# Patient Record
Sex: Male | Born: 1995 | Race: Asian | Hispanic: No | Marital: Single | Smoking: Never smoker
Health system: Southern US, Community
[De-identification: ages and names within clinical notes are randomized; demographics above are authoritative.]

## PROBLEM LIST (undated history)

## (undated) DIAGNOSIS — G43909 Migraine, unspecified, not intractable, without status migrainosus: Secondary | ICD-10-CM

## (undated) DIAGNOSIS — K5190 Ulcerative colitis, unspecified, without complications: Secondary | ICD-10-CM

---

## 2014-06-17 ENCOUNTER — Emergency Department (HOSPITAL_COMMUNITY): Payer: No Typology Code available for payment source

## 2014-06-17 ENCOUNTER — Emergency Department (HOSPITAL_COMMUNITY)
Admission: EM | Admit: 2014-06-17 | Discharge: 2014-06-17 | Disposition: A | Discharge status: Home / Self Care | Payer: No Typology Code available for payment source | Attending: Emergency Medicine | Admitting: Emergency Medicine

## 2014-06-17 ENCOUNTER — Encounter (HOSPITAL_COMMUNITY): Payer: Self-pay | Admitting: Emergency Medicine

## 2014-06-17 DIAGNOSIS — S50812A Abrasion of left forearm, initial encounter: Secondary | ICD-10-CM | POA: Insufficient documentation

## 2014-06-17 DIAGNOSIS — M25552 Pain in left hip: Secondary | ICD-10-CM

## 2014-06-17 DIAGNOSIS — S60511A Abrasion of right hand, initial encounter: Secondary | ICD-10-CM | POA: Insufficient documentation

## 2014-06-17 DIAGNOSIS — S060X0A Concussion without loss of consciousness, initial encounter: Secondary | ICD-10-CM | POA: Diagnosis not present

## 2014-06-17 DIAGNOSIS — Y92410 Unspecified street and highway as the place of occurrence of the external cause: Secondary | ICD-10-CM | POA: Diagnosis not present

## 2014-06-17 DIAGNOSIS — S79912A Unspecified injury of left hip, initial encounter: Secondary | ICD-10-CM | POA: Insufficient documentation

## 2014-06-17 DIAGNOSIS — R110 Nausea: Secondary | ICD-10-CM | POA: Insufficient documentation

## 2014-06-17 DIAGNOSIS — S098XXA Other specified injuries of head, initial encounter: Secondary | ICD-10-CM | POA: Diagnosis present

## 2014-06-17 DIAGNOSIS — T148XXA Other injury of unspecified body region, initial encounter: Secondary | ICD-10-CM

## 2014-06-17 DIAGNOSIS — R42 Dizziness and giddiness: Secondary | ICD-10-CM | POA: Diagnosis not present

## 2014-06-17 DIAGNOSIS — Y9389 Activity, other specified: Secondary | ICD-10-CM | POA: Insufficient documentation

## 2014-06-17 DIAGNOSIS — S30810A Abrasion of lower back and pelvis, initial encounter: Secondary | ICD-10-CM | POA: Diagnosis not present

## 2014-06-17 MED ORDER — HYDROCODONE-ACETAMINOPHEN 5-325 MG PO TABS: 1.0000 | ORAL_TABLET | Freq: Four times a day (QID) | ORAL | Status: DC | PRN

## 2014-06-17 MED ORDER — HYDROCODONE-ACETAMINOPHEN 5-325 MG PO TABS
1.0000 | ORAL_TABLET | Freq: Once | ORAL | Status: AC
  Administered 2014-06-17: 1 via ORAL
  Filled 2014-06-17: qty 1
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
## 2014-06-17 NOTE — ED Provider Notes (Signed)
Medical screening examination/treatment/procedure(s) were performed by non-physician practitioner and as supervising physician I was immediately available for consultation/collaboration.   EKG Interpretation None        Hoy Morn, MD 06/17/14 1626
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
## 2014-06-17 NOTE — ED Notes (Signed)
AVS explained in detail- knows not to take extra tylenol, drink alcohol, drive/operate heavy machinery while taking prescribed medications. Bacitracin and dry dressings applied to abrasions on left arm, hand, and posterior head. No other questions/concerns.

## 2014-06-17 NOTE — Discharge Instructions (Signed)
Abrasion An abrasion is a cut or scrape of the skin. Abrasions do not extend through all layers of the skin and most heal within 10 days. It is important to care for your abrasion properly to prevent infection. CAUSES  Most abrasions are caused by falling on, or gliding across, the ground or other surface. When your skin rubs on something, the outer and inner layer of skin rubs off, causing an abrasion. DIAGNOSIS  Your caregiver will be able to diagnose an abrasion during a physical exam.  TREATMENT  Your treatment depends on how large and deep the abrasion is. Generally, your abrasion will be cleaned with water and a mild soap to remove any dirt or debris. An antibiotic ointment may be put over the abrasion to prevent an infection. A bandage (dressing) may be wrapped around the abrasion to keep it from getting dirty.  You may need a tetanus shot if:  You cannot remember when you had your last tetanus shot.  You have never had a tetanus shot.  The injury broke your skin. If you get a tetanus shot, your arm may swell, get red, and feel warm to the touch. This is common and not a problem. If you need a tetanus shot and you choose not to have one, there is a rare chance of getting tetanus. Sickness from tetanus can be serious.  HOME CARE INSTRUCTIONS   If a dressing was applied, change it at least once a day or as directed by your caregiver. If the bandage sticks, soak it off with warm water.   Wash the area with water and a mild soap to remove all the ointment 2 times a day. Rinse off the soap and pat the area dry with a clean towel.   Reapply any ointment as directed by your caregiver. This will help prevent infection and keep the bandage from sticking. Use gauze over the wound and under the dressing to help keep the bandage from sticking.   Change your dressing right away if it becomes wet or dirty.   Only take over-the-counter or prescription medicines for pain, discomfort, or fever as  directed by your caregiver.   Follow up with your caregiver within 24-48 hours for a wound check, or as directed. If you were not given a wound-check appointment, look closely at your abrasion for redness, swelling, or pus. These are signs of infection. SEEK IMMEDIATE MEDICAL CARE IF:   You have increasing pain in the wound.   You have redness, swelling, or tenderness around the wound.   You have pus coming from the wound.   You have a fever or persistent symptoms for more than 2-3 days.  You have a fever and your symptoms suddenly get worse.  You have a bad smell coming from the wound or dressing.  MAKE SURE YOU:   Understand these instructions.  Will watch your condition.  Will get help right away if you are not doing well or get worse. Document Released: 06/04/2005 Document Revised: 08/11/2012 Document Reviewed: 07/29/2011 [facility] Patient Information 2015 [place], [place]. This information is not intended to replace advice given to you by your health care provider. Make sure you discuss any questions you have with your health care provider.  Bicycling, Adult Cyclists  Whether motivated by recreation or transportation, more adults are taking up cycling than ever before. Learning more about cycling greatly increases confidence. And it can be a great aid in learning to share the road more effectively.  If you are  using your bicycle in different situations than you previously have, such as switching from occasional short recreational rides to regularly commuting to work, you may want to take a short workshop. Begin by assessing yourself: How confident are you in your cycling skills? What would you like to know more about? Are there particular kinds of cycling you would like to try out? Courses and workshops may focus on learning to race, long distance touring, teaching children to cycle safely, commuting, or bike repairs. With that in mind, adult cyclists may wish to check around  their community for bike clubs, classes, rides, and other cycling opportunities. Check with the League of [employer] Bicyclists (www.bikeleague.org) for a listing of instructional opportunities available in your area.  Brush up on riding skills and rules if it has been a while since you cycled regularly.  Adult cyclists who wish to cycle with small children, and cyclists needing to transport cargo, should investigate the various child seats and trailers available. Determine which are the safest and which will work best for you.  Adult cyclists should learn more about off-road cycling, touring, and racing before participating in these activities. Adult cyclists are encouraged to try cycling on multi-use paths. Remember to respect others' needs on the trails.  Do not underestimate the importance of wearing a helmet. Accidents can happen anywhere. Cyclists should always wear a helmet.  Adult cyclists should learn how to handle harassment from motorists and others in traffic. It is in your best interest not to return any harassment or insults.  Just like a car, a bicycle requires basic maintenance to keep running smoothly and safely. Bikes are easy to work on and you can save money by learning bike maintenance. This can be done by picking up a manual and taking a repair course. Those who really do not have time should keep their bicycles regularly serviced at a good bike shop.  Bicycling can fit into one's everyday life. Substitute a bike ride for a car trip. Adult cyclists should know the health and environmental benefits of bicycling. Document Released: 11/15/2003 Document Revised: 01/09/2014 Document Reviewed: 08/21/2008 [facility] Patient Information 2015 [place], [place]. This information is not intended to replace advice given to you by your health care provider. Make sure you discuss any questions you have with your health care provider.  Concussion A concussion is a brain injury. It is caused  by:  A hit to the head.  A quick and sudden movement (jolt) of the head or neck. A concussion is usually not life threatening. Even so, it can cause serious problems. If you had a concussion before, you may have concussion-like problems after a hit to your head. HOME CARE General Instructions  Follow your doctor's directions carefully.  Take medicines only as told by your doctor.  Only take medicines your doctor says are safe.  Do not drink alcohol until your doctor says it is okay. Alcohol and some drugs can slow down healing. They can also put you at risk for further injury.  If you are having trouble remembering things, write them down.  Try to do one thing at a time if you get distracted easily. For example, do not watch TV while making dinner.  Talk to your family members or close friends when making important decisions.  Follow up with your doctor as told.  Watch your symptoms. Tell others to do the same. Serious problems can sometimes happen after a concussion. Older adults are more likely to have these problems.  Tell your teachers, school nurse, school counselor, coach, Product/process development scientist, or work Freight forwarder about your concussion. Tell them about what you can or cannot do. They should watch to see if:  It gets even harder for you to pay attention or concentrate.  It gets even harder for you to remember things or learn new things.  You need more time than normal to finish things.  You become annoyed (irritable) more than before.  You are not able to deal with stress as well.  You have more problems than before.  Rest. Make sure you:  Get plenty of sleep at night.  Go to sleep early.  Go to bed at the same time every day. Try to wake up at the same time.  Rest during the day.  Take naps when you feel tired.  Limit activities where you have to think a lot or concentrate. These include:  Doing homework.  Doing work related to a job.  Watching TV.  Using  the computer. Returning To Your Regular Activities Return to your normal activities slowly, not all at once. You must give your body and brain enough time to heal.   Do not play sports or do other athletic activities until your doctor says it is okay.  Ask your doctor when you can drive, ride a bicycle, or work other vehicles or machines. Never do these things if you feel dizzy.  Ask your doctor about when you can return to work or school. Preventing Another Concussion It is very important to avoid another brain injury, especially before you have healed. In rare cases, another injury can lead to permanent brain damage, brain swelling, or death. The risk of this is greatest during the first 7-10 days after your injury. Avoid injuries by:   Wearing a seat belt when riding in a car.  Not drinking too much alcohol.  Avoiding activities that could lead to a second concussion (such as contact sports).  Wearing a helmet when doing activities like:  Biking.  Skiing.  Skateboarding.  Skating.  Making your home safer by:  Removing things from the floor or stairways that could make you trip.  Using grab bars in bathrooms and handrails by stairs.  Placing non-slip mats on floors and in bathtubs.  Improve lighting in dark areas. GET HELP IF:  It gets even harder for you to pay attention or concentrate.  It gets even harder for you to remember things or learn new things.  You need more time than normal to finish things.  You become annoyed (irritable) more than before.  You are not able to deal with stress as well.  You have more problems than before.  You have problems keeping your balance.  You are not able to react quickly when you should. Get help if you have any of these problems for more than 2 weeks:   Lasting (chronic) headaches.  Dizziness or trouble balancing.  Feeling sick to your stomach (nausea).  Seeing (vision) problems.  Being affected by noises or  light more than normal.  Feeling sad, low, down in the dumps, blue, gloomy, or empty (depressed).  Mood changes (mood swings).  Feeling of fear or nervousness about what may happen (anxiety).  Feeling annoyed.  Memory problems.  Problems concentrating or paying attention.  Sleep problems.  Feeling tired all the time. GET HELP RIGHT AWAY IF:   You have bad headaches or your headaches get worse.  You have weakness (even if it is in one hand,  leg, or part of the face).  You have loss of feeling (numbness).  You feel off balance.  You keep throwing up (vomiting).  You feel tired.  One black center of your eye (pupil) is larger than the other.  You twitch or shake violently (convulse).  Your speech is not clear (slurred).  You are more confused, easily angered (agitated), or annoyed than before.  You have more trouble resting than before.  You are unable to recognize people or places.  You have neck pain.  It is difficult to wake you up.  You have unusual behavior changes.  You pass out (lose consciousness). MAKE SURE YOU:   Understand these instructions.  Will watch your condition.  Will get help right away if you are not doing well or get worse. Document Released: 08/13/2009 Document Revised: 01/09/2014 Document Reviewed: 03/17/2013 [facility] Patient Information 2015 [place], [place]. This information is not intended to replace advice given to you by your health care provider. Make sure you discuss any questions you have with your health care provider.

## 2014-10-23 ENCOUNTER — Emergency Department (HOSPITAL_COMMUNITY)
Admission: EM | Admit: 2014-10-23 | Discharge: 2014-10-23 | Disposition: A | Discharge status: Home / Self Care | Payer: Medicaid Other | Attending: Emergency Medicine | Admitting: Emergency Medicine

## 2014-10-23 ENCOUNTER — Encounter (HOSPITAL_COMMUNITY): Payer: Self-pay

## 2014-10-23 ENCOUNTER — Emergency Department (HOSPITAL_COMMUNITY): Payer: Medicaid Other

## 2014-10-23 DIAGNOSIS — R1030 Lower abdominal pain, unspecified: Secondary | ICD-10-CM

## 2014-10-23 DIAGNOSIS — A09 Infectious gastroenteritis and colitis, unspecified: Secondary | ICD-10-CM | POA: Insufficient documentation

## 2014-10-23 DIAGNOSIS — Z792 Long term (current) use of antibiotics: Secondary | ICD-10-CM | POA: Insufficient documentation

## 2014-10-23 DIAGNOSIS — Z7982 Long term (current) use of aspirin: Secondary | ICD-10-CM | POA: Diagnosis not present

## 2014-10-23 LAB — COMPREHENSIVE METABOLIC PANEL
ALBUMIN: 3.9 g/dL (ref 3.5–5.2)
ALT: 22 U/L (ref 0–53)
AST: 19 U/L (ref 0–37)
Alkaline Phosphatase: 61 U/L (ref 39–117)
Anion gap: 8 (ref 5–15)
BILIRUBIN TOTAL: 0.8 mg/dL (ref 0.3–1.2)
BUN: 8 mg/dL (ref 6–23)
CALCIUM: 9.1 mg/dL (ref 8.4–10.5)
CHLORIDE: 100 mmol/L (ref 96–112)
CO2: 29 mmol/L (ref 19–32)
Creatinine, Ser: 0.93 mg/dL (ref 0.50–1.35)
GFR calc Af Amer: >90 mL/min (ref >90)
GFR calc non Af Amer: >90 mL/min (ref >90)
GLUCOSE: 88 mg/dL (ref 70–99)
Potassium: 3.8 mmol/L (ref 3.5–5.1)
SODIUM: 137 mmol/L (ref 135–145)
TOTAL PROTEIN: 7.5 g/dL (ref 6.0–8.3)

## 2014-10-23 LAB — CBC WITH DIFFERENTIAL/PLATELET
Basophils Absolute: 0 10*3/uL (ref 0.0–0.1)
Basophils Relative: 1 % (ref 0–1)
EOS ABS: 0.6 10*3/uL (ref 0.0–0.7)
EOS PCT: 6 % — AB (ref 0–5)
HEMATOCRIT: 43.7 % (ref 39.0–52.0)
Hemoglobin: 15.4 g/dL (ref 13.0–17.0)
Lymphocytes Relative: 21 % (ref 12–46)
Lymphs Abs: 1.8 10*3/uL (ref 0.7–4.0)
MCH: 28.7 pg (ref 26.0–34.0)
MCHC: 35.2 g/dL (ref 30.0–36.0)
MCV: 81.5 fL (ref 78.0–100.0)
MONOS PCT: 12 % (ref 3–12)
Monocytes Absolute: 1.1 10*3/uL — ABNORMAL HIGH (ref 0.1–1.0)
NEUTROS PCT: 60 % (ref 43–77)
Neutro Abs: 5.4 10*3/uL (ref 1.7–7.7)
Platelets: 267 10*3/uL (ref 150–400)
RBC: 5.36 MIL/uL (ref 4.22–5.81)
RDW: 12.5 % (ref 11.5–15.5)
WBC: 8.9 10*3/uL (ref 4.0–10.5)

## 2014-10-23 LAB — URINALYSIS, ROUTINE W REFLEX MICROSCOPIC
Bilirubin Urine: NEGATIVE
Glucose, UA: NEGATIVE mg/dL
Ketones, ur: 40 mg/dL — AB
Leukocytes, UA: NEGATIVE
Nitrite: NEGATIVE
PH: 6 (ref 5.0–8.0)
Protein, ur: NEGATIVE mg/dL
SPECIFIC GRAVITY, URINE: 1.014 (ref 1.005–1.030)
Urobilinogen, UA: 0.2 mg/dL (ref 0.0–1.0)

## 2014-10-23 LAB — LIPASE, BLOOD: Lipase: 25 U/L (ref 11–59)

## 2014-10-23 LAB — URINE MICROSCOPIC-ADD ON

## 2014-10-23 LAB — POC OCCULT BLOOD, ED: FECAL OCCULT BLD: POSITIVE — AB

## 2014-10-23 MED ORDER — IOHEXOL 300 MG/ML  SOLN: 50.0000 mL | Freq: Once | INTRAMUSCULAR | Status: DC | PRN

## 2014-10-23 MED ORDER — SODIUM CHLORIDE 0.9 % IV BOLUS (SEPSIS)
1000.0000 mL | INTRAVENOUS | Status: AC
  Administered 2014-10-23: 1000 mL via INTRAVENOUS

## 2014-10-23 MED ORDER — IOHEXOL 300 MG/ML  SOLN
100.0000 mL | Freq: Once | INTRAMUSCULAR | Status: AC | PRN
  Administered 2014-10-23: 100 mL via INTRAVENOUS

## 2014-10-23 MED ORDER — OXYCODONE-ACETAMINOPHEN 5-325 MG PO TABS: 1.0000 | ORAL_TABLET | Freq: Four times a day (QID) | ORAL | Status: DC | PRN

## 2014-10-23 NOTE — ED Notes (Signed)
Pt presents with c/o abdominal pain and rectal bleeding. Pt reports he initially had diarrhea that started a week ago but approx 3 days ago he started having some rectal bleeding. Pt denies any vomiting.

## 2014-10-23 NOTE — ED Provider Notes (Signed)
CSN: [id number]     Arrival date & time 10/23/14  41 History   First MD Initiated Contact with Patient 10/23/14 1830     Chief Complaint  Patient presents with  . Abdominal Pain  . Rectal Bleeding     (Consider location/radiation/quality/duration/timing/severity/associated sxs/prior Treatment) Patient is a 19 y.o. male presenting with abdominal pain and hematochezia. The history is provided by the patient.  Abdominal Pain Pain location: lower abdomen. Pain quality: burning   Pain radiates to:  Does not radiate Pain severity:  Mild Onset quality:  Gradual Duration:  3 days Timing:  Constant Progression:  Unchanged Chronicity:  New Context: suspicious food intake   Relieved by:  Nothing Worsened by:  Nothing tried Ineffective treatments:  None tried Associated symptoms: diarrhea, hematochezia and nausea   Associated symptoms: no chest pain, no cough, no dysuria, no fever, no hematuria, no shortness of breath and no vomiting   Diarrhea:    Quality:  Watery (dark, and dark w/ blood)   Severity:  Moderate   Duration:  1 week   Timing:  Constant   Progression:  Unchanged Rectal Bleeding Associated symptoms: abdominal pain   Associated symptoms: no fever and no vomiting     History reviewed. No pertinent past medical history. History reviewed. No pertinent past surgical history. No family history on file. History  Substance Use Topics  . Smoking status: Never Smoker   . Smokeless tobacco: Not on file  . Alcohol Use: No    Review of Systems  Constitutional: Negative for fever.  HENT: Negative for drooling and rhinorrhea.   Eyes: Negative for pain.  Respiratory: Negative for cough and shortness of breath.   Cardiovascular: Negative for chest pain and leg swelling.  Gastrointestinal: Positive for nausea, abdominal pain, diarrhea and hematochezia. Negative for vomiting.  Genitourinary: Negative for dysuria and hematuria.  Musculoskeletal: Negative for gait problem and  neck pain.  Skin: Negative for color change.  Neurological: Negative for numbness and headaches.  Hematological: Negative for adenopathy.  Psychiatric/Behavioral: Negative for behavioral problems.  All other systems reviewed and are negative.     Allergies  Review of patient's allergies indicates no known allergies.  Home Medications   Prior to Admission medications   Medication Sig Start Date End Date Taking? Authorizing Provider  aspirin 325 MG tablet Take 325 mg by mouth daily as needed for headache (headache).   Yes Historical Provider, MD  ibuprofen (ADVIL,MOTRIN) 200 MG tablet Take 400 mg by mouth every 6 (six) hours as needed for moderate pain (pain).   Yes Historical Provider, MD  loperamide (IMODIUM) 2 MG capsule Take 2 mg by mouth daily as needed for diarrhea or loose stools (diarrhea).   Yes Historical Provider, MD  ciprofloxacin (CIPRO) 500 MG tablet Take 500 mg by mouth 2 (two) times daily. For 7 Days.    Historical Provider, MD  HYDROcodone-acetaminophen (NORCO/VICODIN) 5-325 MG per tablet Take 1-2 tablets by mouth every 6 (six) hours as needed. Patient not taking: Reported on 10/23/2014 06/17/14   Glendell Docker, NP   BP 132/54 mmHg  Pulse 98  Temp(Src) 97.9 F (36.6 C) (Oral)  Resp 18  SpO2 100% Physical Exam  Constitutional: He is oriented to person, place, and time. He appears well-developed and well-nourished.  HENT:  Head: Normocephalic and atraumatic.  Right Ear: External ear normal.  Left Ear: External ear normal.  Nose: Nose normal.  Mouth/Throat: Oropharynx is clear and moist. No oropharyngeal exudate.  Eyes: Conjunctivae and EOM are normal.  Pupils are equal, round, and reactive to light.  Neck: Normal range of motion. Neck supple.  Cardiovascular: Normal rate, regular rhythm, normal heart sounds and intact distal pulses.  Exam reveals no gallop and no friction rub.   No murmur heard. Pulmonary/Chest: Effort normal and breath sounds normal. No  respiratory distress. He has no wheezes.  Abdominal: Soft. Bowel sounds are normal. He exhibits no distension. There is tenderness. There is no rebound and no guarding.  Mild tenderness to palpation of the lower abdomen diffusely.   Genitourinary:  Normal-appearing external rectum. Poor stool sample but no obvious blood seen. Otherwise normal rectal exam.  Musculoskeletal: Normal range of motion. He exhibits no edema or tenderness.  Neurological: He is alert and oriented to person, place, and time.  Skin: Skin is warm and dry.  Psychiatric: He has a normal mood and affect. His behavior is normal.  Nursing note and vitals reviewed.   ED Course  Procedures (including critical care time) Labs Review Labs Reviewed  CBC WITH DIFFERENTIAL/PLATELET - Abnormal; Notable for the following:    Monocytes Absolute 1.1 (*)    Eosinophils Relative 6 (*)    All other components within normal limits  URINALYSIS, ROUTINE W REFLEX MICROSCOPIC - Abnormal; Notable for the following:    Hgb urine dipstick SMALL (*)    Ketones, ur 40 (*)    All other components within normal limits  POC OCCULT BLOOD, ED - Abnormal; Notable for the following:    Fecal Occult Bld POSITIVE (*)    All other components within normal limits  COMPREHENSIVE METABOLIC PANEL  LIPASE, BLOOD  URINE MICROSCOPIC-ADD ON  OCCULT BLOOD X 1 CARD TO LAB, STOOL    Imaging Review Ct Abdomen Pelvis W Contrast  10/23/2014   CLINICAL DATA:  Lower abdominal pain and diarrhea for 3 days.  EXAM: CT ABDOMEN AND PELVIS WITH CONTRAST  TECHNIQUE: Multidetector CT imaging of the abdomen and pelvis was performed using the standard protocol following bolus administration of intravenous contrast.  CONTRAST:  100 mL OMNIPAQUE IOHEXOL 300 MG/ML  SOLN  COMPARISON:  None.  FINDINGS: The lung bases are clear no pleural or pericardial effusion.  There is thickening of the walls of the colon, particularly the ascending and transverse. The terminal ileum and  remainder of the small bowel appear normal. The stomach is unremarkable. No fluid collection is seen. Small right lower quadrant lymph nodes are noted and likely reactive. The appendix is normal in appearance. There is no pneumatosis, portal venous gas, fluid collection for free air.  The gallbladder, liver, spleen, adrenal glands, pancreas and kidneys appear normal. No focal bony abnormality is identified.  IMPRESSION: Findings compatible with infectious or inflammatory colitis most notable in the ascending and transverse colon. The examination is otherwise negative.   Electronically Signed   By: Inge Rise M.D.   On: 10/23/2014 21:02     EKG Interpretation None      MDM   Final diagnoses:  Lower abdominal pain  Infectious colitis    7:53 PM 19 y.o. male who presents with lower abdominal pain and diarrhea which began approximately 1 week ago after eating a hamburger from cookout. He notes that he has had mild diarrhea for the last week which worsened over the last 3 days and has become dark and bloody appearing. He denies any fevers, or vomiting. He has had some nausea. Vital signs unremarkable here. We'll get screening labs and imaging.  10:29 PM: I interpreted/reviewed the labs and/or imaging  which were non-contributory.   CT consistent with inflammatory and infectious colitis. The patient's pain is minimal and he has required no pain medicine here. We'll provide prescription for Percocet for home. He was given a prescription for Cipro for 7 days by the urgent care review seen at prior to coming here. I have discussed the diagnosis/risks/treatment options with the patient and believe the pt to be eligible for discharge home to follow-up with at the wellness center as needed. We also discussed returning to the ED immediately if new or worsening sx occur. We discussed the sx which are most concerning (e.g., worsening pain, fever, worsening blood in diarrhea) that necessitate immediate return.  Medications administered to the patient during their visit and any new prescriptions provided to the patient are listed below.  Medications given during this visit Medications  iohexol (OMNIPAQUE) 300 MG/ML solution 50 mL (not administered)  sodium chloride 0.9 % bolus 1,000 mL (1,000 mLs Intravenous New Bag/Given 10/23/14 1938)  iohexol (OMNIPAQUE) 300 MG/ML solution 100 mL (100 mLs Intravenous Contrast Given 10/23/14 2047)    New Prescriptions   OXYCODONE-ACETAMINOPHEN (PERCOCET) 5-325 MG PER TABLET    Take 1 tablet by mouth every 6 (six) hours as needed for moderate pain.     Pamella Pert, MD 10/23/14 2230

## 2014-11-07 ENCOUNTER — Inpatient Hospital Stay (HOSPITAL_COMMUNITY)
Admission: EM | Admit: 2014-11-07 | Discharge: 2014-11-11 | DRG: 385 | Disposition: A | Discharge status: Home / Self Care | Payer: Medicaid Other | Attending: Internal Medicine | Admitting: Internal Medicine

## 2014-11-07 ENCOUNTER — Encounter (HOSPITAL_COMMUNITY): Payer: Self-pay | Admitting: Emergency Medicine

## 2014-11-07 ENCOUNTER — Emergency Department (HOSPITAL_COMMUNITY): Payer: Medicaid Other

## 2014-11-07 DIAGNOSIS — K648 Other hemorrhoids: Secondary | ICD-10-CM | POA: Diagnosis present

## 2014-11-07 DIAGNOSIS — D62 Acute posthemorrhagic anemia: Secondary | ICD-10-CM | POA: Diagnosis present

## 2014-11-07 DIAGNOSIS — K529 Noninfective gastroenteritis and colitis, unspecified: Secondary | ICD-10-CM | POA: Diagnosis present

## 2014-11-07 DIAGNOSIS — K644 Residual hemorrhoidal skin tags: Secondary | ICD-10-CM | POA: Diagnosis present

## 2014-11-07 DIAGNOSIS — A09 Infectious gastroenteritis and colitis, unspecified: Secondary | ICD-10-CM | POA: Diagnosis present

## 2014-11-07 DIAGNOSIS — E876 Hypokalemia: Secondary | ICD-10-CM | POA: Diagnosis present

## 2014-11-07 DIAGNOSIS — E43 Unspecified severe protein-calorie malnutrition: Secondary | ICD-10-CM | POA: Diagnosis present

## 2014-11-07 DIAGNOSIS — K5190 Ulcerative colitis, unspecified, without complications: Secondary | ICD-10-CM | POA: Diagnosis present

## 2014-11-07 LAB — URINALYSIS, ROUTINE W REFLEX MICROSCOPIC
Glucose, UA: NEGATIVE mg/dL
Ketones, ur: >80 mg/dL — AB
LEUKOCYTES UA: NEGATIVE
NITRITE: NEGATIVE
PROTEIN: 30 mg/dL — AB
Specific Gravity, Urine: 1.031 — ABNORMAL HIGH (ref 1.005–1.030)
UROBILINOGEN UA: 0.2 mg/dL (ref 0.0–1.0)
pH: 6 (ref 5.0–8.0)

## 2014-11-07 LAB — LIPASE, BLOOD: LIPASE: 15 U/L (ref 11–59)

## 2014-11-07 LAB — COMPREHENSIVE METABOLIC PANEL
ALT: 13 U/L (ref 0–53)
ANION GAP: 14 (ref 5–15)
AST: 16 U/L (ref 0–37)
Albumin: 3.3 g/dL — ABNORMAL LOW (ref 3.5–5.2)
Alkaline Phosphatase: 48 U/L (ref 39–117)
BILIRUBIN TOTAL: 0.8 mg/dL (ref 0.3–1.2)
BUN: 10 mg/dL (ref 6–23)
CO2: 24 mmol/L (ref 19–32)
CREATININE: 0.93 mg/dL (ref 0.50–1.35)
Calcium: 8.6 mg/dL (ref 8.4–10.5)
Chloride: 96 mmol/L (ref 96–112)
Glucose, Bld: 89 mg/dL (ref 70–99)
Potassium: 3.8 mmol/L (ref 3.5–5.1)
Sodium: 134 mmol/L — ABNORMAL LOW (ref 135–145)
Total Protein: 6.7 g/dL (ref 6.0–8.3)

## 2014-11-07 LAB — CBC WITH DIFFERENTIAL/PLATELET
Basophils Absolute: 0 10*3/uL (ref 0.0–0.1)
Basophils Relative: 0 % (ref 0–1)
Eosinophils Absolute: 0.2 10*3/uL (ref 0.0–0.7)
Eosinophils Relative: 2 % (ref 0–5)
HCT: 37.3 % — ABNORMAL LOW (ref 39.0–52.0)
HEMOGLOBIN: 13.1 g/dL (ref 13.0–17.0)
LYMPHS PCT: 11 % — AB (ref 12–46)
Lymphs Abs: 1.4 10*3/uL (ref 0.7–4.0)
MCH: 28.9 pg (ref 26.0–34.0)
MCHC: 35.1 g/dL (ref 30.0–36.0)
MCV: 82.2 fL (ref 78.0–100.0)
MONOS PCT: 9 % (ref 3–12)
Monocytes Absolute: 1.1 10*3/uL — ABNORMAL HIGH (ref 0.1–1.0)
NEUTROS PCT: 78 % — AB (ref 43–77)
Neutro Abs: 9.7 10*3/uL — ABNORMAL HIGH (ref 1.7–7.7)
Platelets: 329 10*3/uL (ref 150–400)
RBC: 4.54 MIL/uL (ref 4.22–5.81)
RDW: 13.1 % (ref 11.5–15.5)
WBC: 12.4 10*3/uL — ABNORMAL HIGH (ref 4.0–10.5)

## 2014-11-07 LAB — URINE MICROSCOPIC-ADD ON

## 2014-11-07 LAB — POC OCCULT BLOOD, ED: Fecal Occult Bld: POSITIVE — AB

## 2014-11-07 MED ORDER — SODIUM CHLORIDE 0.9 % IV BOLUS (SEPSIS)
1000.0000 mL | Freq: Once | INTRAVENOUS | Status: AC
  Administered 2014-11-07: 1000 mL via INTRAVENOUS

## 2014-11-07 MED ORDER — ACETAMINOPHEN 325 MG PO TABS
650.0000 mg | ORAL_TABLET | Freq: Four times a day (QID) | ORAL | Status: DC | PRN
  Administered 2014-11-09 (×2): 650 mg via ORAL
  Filled 2014-11-07 (×2): qty 2

## 2014-11-07 MED ORDER — HYOSCYAMINE SULFATE 0.125 MG SL SUBL
0.1250 mg | SUBLINGUAL_TABLET | Freq: Once | SUBLINGUAL | Status: AC
  Administered 2014-11-07: 0.125 mg via SUBLINGUAL
  Filled 2014-11-07: qty 1

## 2014-11-07 MED ORDER — IOHEXOL 300 MG/ML  SOLN
100.0000 mL | Freq: Once | INTRAMUSCULAR | Status: AC | PRN
  Administered 2014-11-07: 100 mL via INTRAVENOUS

## 2014-11-07 MED ORDER — HYOSCYAMINE SULFATE 0.125 MG PO TABS
0.1250 mg | ORAL_TABLET | Freq: Once | ORAL | Status: DC
Start: 2014-11-07 — End: 2014-11-07

## 2014-11-07 MED ORDER — HYDROMORPHONE HCL 1 MG/ML IJ SOLN
1.0000 mg | INTRAMUSCULAR | Status: DC | PRN
  Administered 2014-11-08 – 2014-11-10 (×6): 1 mg via INTRAVENOUS
  Filled 2014-11-07 (×6): qty 1

## 2014-11-07 MED ORDER — ONDANSETRON HCL 4 MG/2ML IJ SOLN
4.0000 mg | Freq: Once | INTRAMUSCULAR | Status: AC
  Administered 2014-11-07: 4 mg via INTRAVENOUS
  Filled 2014-11-07: qty 2

## 2014-11-07 MED ORDER — ONDANSETRON HCL 4 MG/2ML IJ SOLN
4.0000 mg | Freq: Four times a day (QID) | INTRAMUSCULAR | Status: DC | PRN
  Administered 2014-11-08 – 2014-11-10 (×6): 4 mg via INTRAVENOUS
  Filled 2014-11-07 (×6): qty 2

## 2014-11-07 MED ORDER — ACETAMINOPHEN 650 MG RE SUPP: 650.0000 mg | Freq: Four times a day (QID) | RECTAL | Status: DC | PRN

## 2014-11-07 MED ORDER — SODIUM CHLORIDE 0.9 % IV SOLN
INTRAVENOUS | Status: DC
  Administered 2014-11-08: 11:00:00 via INTRAVENOUS
  Administered 2014-11-08 (×2): 100 mL via INTRAVENOUS
  Administered 2014-11-09 – 2014-11-10 (×2): via INTRAVENOUS

## 2014-11-07 NOTE — ED Notes (Signed)
MD at bedside. 

## 2014-11-07 NOTE — H&P (Signed)
[facility] History and Physical  Spencer Williams CXK:[id number] DOB: [birth year] DOA: 11/07/2014  Referring physician: EDP PCP: No PCP Per Patient   Chief Complaint: abd pain and diarrhea  HPI: Spencer Williams is a 19 y.o. male  With no significant past medical history up until 2 weeks ago when he was diagnosed with colitis,  He was treated with ten-day course of Cipro and Flagyl with minimal relief only with the pain medicines that he received. 2 days ago started having worsening lower abdominal pain with vomiting and bloody diarrhea. She also reports episodic low-grade fevers and approximately 20-30 pound weight loss in 3 weeks.  He denies any recent travel, no sick contacts.  no family history of IBD.  in the ER underwent a CT abdomen which showed diffuse colitis worsened compared to CT on 2/15.  EDP Discussed with GI Dr.Ganem who recommended GI consult in a.m.  Review of Systems:  Positive bolded Constitutional:  weight loss, night sweats, Fevers, chills, fatigue.  HEENT:  No headaches, Difficulty swallowing,Tooth/dental problems,Sore throat,  No sneezing, itching, ear ache, nasal congestion, post nasal drip,  Cardio-vascular:  No chest pain, Orthopnea, PND, swelling in lower extremities, anasarca, dizziness, palpitations  GI:  No heartburn, indigestion, abdominal pain, nausea, vomiting, diarrhea, change in bowel habits, loss of appetite  Resp:  No shortness of breath with exertion or at rest. No excess mucus, no productive cough, No non-productive cough, No coughing up of blood.No change in color of mucus.No wheezing.No chest wall deformity  Skin:  no rash or lesions.  GU:  no dysuria, change in color of urine, no urgency or frequency. No flank pain.  Musculoskeletal:  No joint pain or swelling. No decreased range of motion. No back pain.  Psych:  No change in mood or affect. No depression or anxiety. No memory loss.   History reviewed. No pertinent past medical  history. History reviewed. No pertinent past surgical history. Social History:  reports that he has never smoked. He does not have any smokeless tobacco history on file. He reports that he does not drink alcohol. His drug history is not on file.  No Known Allergies   family history:  both parents living and healthy he denies any history of chronic medical problems in them  Prior to Admission medications   Medication Sig Start Date End Date Taking? Authorizing Provider  oxyCODONE-acetaminophen (PERCOCET) 5-325 MG per tablet Take 1 tablet by mouth every 6 (six) hours as needed for moderate pain. 10/23/14  Yes Pamella Pert, MD  ciprofloxacin (CIPRO) 500 MG tablet Take 500 mg by mouth 2 (two) times daily. For 7 Days.    Historical Provider, MD  HYDROcodone-acetaminophen (NORCO/VICODIN) 5-325 MG per tablet Take 1-2 tablets by mouth every 6 (six) hours as needed. Patient not taking: Reported on 10/23/2014 06/17/14   Glendell Docker, NP   Physical Exam: Filed Vitals:   11/07/14 1702 11/07/14 1918 11/07/14 2134 11/07/14 2314  BP: 118/60 121/56 113/55 126/60  Pulse: 78 95 70 59  Temp: 98.1 F (36.7 C)   98.8 F (37.1 C)  TempSrc: Oral   Oral  Resp: 19 14 20 16   SpO2: 100% 98% 100% 98%    Wt Readings from Last 3 Encounters:  No data found for Wt    General:  Appears calm and comfortable,  No distress Eyes: PERRL, normal lids, irises & conjunctiva ENT: grossly normal hearing, lips & tongue Neck: no LAD, masses or thyromegaly Cardiovascular: RRR, no m/r/g. No LE edema. Telemetry: SR, no  arrhythmias  Respiratory: CTA bilaterally, no w/r/r. Normal respiratory effort. Abdomen: soft,  Mild lower abdominal tenderness noticed T [place] rebound,  Bowel sounds diminished but present Skin: no rash or induration seen on limited exam Musculoskeletal: grossly normal tone BUE/BLE,  Small raised erythematous rash on the ankle of his right foot Psychiatric: grossly normal mood and affect, speech  fluent and appropriate Neurologic: grossly non-focal.          Labs on Admission:  Basic Metabolic Panel:  Recent Labs Lab 11/07/14 1823  NA 134*  K 3.8  CL 96  CO2 24  GLUCOSE 89  BUN 10  CREATININE 0.93  CALCIUM 8.6   Liver Function Tests:  Recent Labs Lab 11/07/14 1823  AST 16  ALT 13  ALKPHOS 48  BILITOT 0.8  PROT 6.7  ALBUMIN 3.3*    Recent Labs Lab 11/07/14 1823  LIPASE 15   No results for input(s): AMMONIA in the last 168 hours. CBC:  Recent Labs Lab 11/07/14 1823  WBC 12.4*  NEUTROABS 9.7*  HGB 13.1  HCT 37.3*  MCV 82.2  PLT 329   Cardiac Enzymes: No results for input(s): CKTOTAL, CKMB, CKMBINDEX, TROPONINI in the last 168 hours.  BNP (last 3 results) No results for input(s): BNP in the last 8760 hours.  ProBNP (last 3 results) No results for input(s): PROBNP in the last 8760 hours.  CBG: No results for input(s): GLUCAP in the last 168 hours.  Radiological Exams on Admission: Ct Abdomen Pelvis W Contrast  11/07/2014   CLINICAL DATA:  Abdominal pain with nausea and vomiting  EXAM: CT ABDOMEN AND PELVIS WITH CONTRAST  TECHNIQUE: Multidetector CT imaging of the abdomen and pelvis was performed using the standard protocol following bolus administration of intravenous contrast. Oral contrast was also administered.  CONTRAST:  135m OMNIPAQUE IOHEXOL 300 MG/ML  SOLN  COMPARISON:  October 23, 2014  FINDINGS: Lung bases are clear.  No focal liver lesions are identified. The gallbladder wall is not appreciably thickened. There is no biliary duct dilatation.  Spleen, pancreas, and adrenals appear normal. Kidneys bilaterally show no mass or hydronephrosis. There is no renal or ureteral calculus on either side.  In the pelvis, the urinary bladder is midline with normal wall thickness. There is no pelvic mass or pelvic fluid collection.  There is wall thickening throughout much of the colon with hyperemia in the wall. This finding involves virtually all of  the colon and upper rectum and is more extensive than on recent prior study.  There is no bowel obstruction. No free air or portal venous air. The appendix appears normal.  There is no ascites, adenopathy, or abscess in the abdomen or pelvis. There is no evidence of abdominal aortic aneurysm. There are no blastic or lytic bone lesions.  IMPRESSION: Diffuse colitis, most likely inflammatory or infectious etiology. There has been overall progression of colitis compared to the previous study with a more generalized pattern of colitis compared to the previous study. There is no evidence of bowel obstruction or free air. Appendix appears normal. No small bowel abnormality appreciable on this study. Study otherwise unremarkable.   Electronically Signed   By: WLowella GripIII M.D.   On: 11/07/2014 21:30     Assessment/Plan   1.  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 Difficile PCR and GI pathogen panel -  Empiric IV Zosyn, IV  Fluids look some [facility] for nights - clear liquid diet advance as tolerated - [facility]  consult in am  Code Status: FUll COde DVT Prophylaxis: SCD  Family Communication:  none at bedside Disposition Plan: home when stable  Time spent: 43mn  Spencer Williams [facility] Pager [phone]

## 2014-11-07 NOTE — ED Notes (Signed)
Pt states that he was here couple weeks ago for same symptoms and was told he had colitis. Pt c/o lower abd cramps, v/d and fatigue.  Pt states that he has Percocets that he was prescribed but tries not to take them but once every 10 hours.

## 2014-11-07 NOTE — ED Notes (Signed)
Pt stated he is unable to finish drinking his contrast it is making him sick on his stomach.

## 2014-11-07 NOTE — ED Provider Notes (Signed)
CSN: [id number]     Arrival date & time 11/07/14  1649 History   First MD Initiated Contact with Patient 11/07/14 1736     Chief Complaint  Patient presents with  . Abdominal Cramping  . Fatigue  . Emesis  . Diarrhea      Patient is a 19 y.o. male presenting with cramps, vomiting, and diarrhea. The history is provided by the patient. No language interpreter was used.  Abdominal Cramping  Emesis Associated symptoms: diarrhea   Diarrhea Associated symptoms: vomiting    Mr. Antkowiak presents for evaluation of abdominal pain and fatigue.  He has lower abdominal pain for the last three weeks.  It is described as feeling over-stretched.  Pain is constantly a 4-5 but at times gets to a 10.  It is worse with eating, drinking, movement.  He denies fevers.  He has associated nausea and vomiting (for the last two days).  He is having more than 10 bowel movements daily that are bloody.  He has lost thirty pounds in the last three weeks.  He reports feeling short of breath for the last two days.  Breathing is worse when the pain is worse.  Sxs are severe, constant, worsening.    History reviewed. No pertinent past medical history. History reviewed. No pertinent past surgical history. No family history on file. History  Substance Use Topics  . Smoking status: Never Smoker   . Smokeless tobacco: Not on file  . Alcohol Use: No    Review of Systems  Gastrointestinal: Positive for vomiting and diarrhea.  All other systems reviewed and are negative.     Allergies  Review of patient's allergies indicates no known allergies.  Home Medications   Prior to Admission medications   Medication Sig Start Date End Date Taking? Authorizing Provider  oxyCODONE-acetaminophen (PERCOCET) 5-325 MG per tablet Take 1 tablet by mouth every 6 (six) hours as needed for moderate pain. 10/23/14  Yes Pamella Pert, MD  ciprofloxacin (CIPRO) 500 MG tablet Take 500 mg by mouth 2 (two) times daily. For 7 Days.     Historical Provider, MD  HYDROcodone-acetaminophen (NORCO/VICODIN) 5-325 MG per tablet Take 1-2 tablets by mouth every 6 (six) hours as needed. Patient not taking: Reported on 10/23/2014 06/17/14   Glendell Docker, NP   BP 118/60 mmHg  Pulse 78  Temp(Src) 98.1 F (36.7 C) (Oral)  Resp 19  SpO2 100% Physical Exam  Constitutional: He is oriented to person, place, and time. He appears well-developed and well-nourished.  Mild distress  HENT:  Head: Normocephalic and atraumatic.  Cardiovascular: Normal rate and regular rhythm.   No murmur heard. Pulmonary/Chest: Effort normal and breath sounds normal. No respiratory distress.  Abdominal: Soft. There is no rebound and no guarding.  Mild diffuse abdominal tenderness  Genitourinary:  Brown stool  Musculoskeletal: He exhibits no edema or tenderness.  Neurological: He is alert and oriented to person, place, and time.  Skin: Skin is warm and dry.  Psychiatric: He has a normal mood and affect. His behavior is normal.  Nursing note and vitals reviewed.   ED Course  Procedures (including critical care time) Labs Review Labs Reviewed  CBC WITH DIFFERENTIAL/PLATELET - Abnormal; Notable for the following:    WBC 12.4 (*)    HCT 37.3 (*)    Neutrophils Relative % 78 (*)    Lymphocytes Relative 11 (*)    Neutro Abs 9.7 (*)    Monocytes Absolute 1.1 (*)    All other components within  normal limits  COMPREHENSIVE METABOLIC PANEL - Abnormal; Notable for the following:    Sodium 134 (*)    Albumin 3.3 (*)    All other components within normal limits  URINALYSIS, ROUTINE W REFLEX MICROSCOPIC - Abnormal; Notable for the following:    Specific Gravity, Urine 1.031 (*)    Hgb urine dipstick SMALL (*)    Bilirubin Urine SMALL (*)    Ketones, ur >80 (*)    Protein, ur 30 (*)    All other components within normal limits  POC OCCULT BLOOD, ED - Abnormal; Notable for the following:    Fecal Occult Bld POSITIVE (*)    All other components within  normal limits  CLOSTRIDIUM DIFFICILE BY PCR  LIPASE, BLOOD  URINE MICROSCOPIC-ADD ON  GI PATHOGEN PANEL BY PCR, STOOL  CBC  BASIC METABOLIC PANEL    Imaging Review Ct Abdomen Pelvis W Contrast  11/07/2014   CLINICAL DATA:  Abdominal pain with nausea and vomiting  EXAM: CT ABDOMEN AND PELVIS WITH CONTRAST  TECHNIQUE: Multidetector CT imaging of the abdomen and pelvis was performed using the standard protocol following bolus administration of intravenous contrast. Oral contrast was also administered.  CONTRAST:  166m OMNIPAQUE IOHEXOL 300 MG/ML  SOLN  COMPARISON:  October 23, 2014  FINDINGS: Lung bases are clear.  No focal liver lesions are identified. The gallbladder wall is not appreciably thickened. There is no biliary duct dilatation.  Spleen, pancreas, and adrenals appear normal. Kidneys bilaterally show no mass or hydronephrosis. There is no renal or ureteral calculus on either side.  In the pelvis, the urinary bladder is midline with normal wall thickness. There is no pelvic mass or pelvic fluid collection.  There is wall thickening throughout much of the colon with hyperemia in the wall. This finding involves virtually all of the colon and upper rectum and is more extensive than on recent prior study.  There is no bowel obstruction. No free air or portal venous air. The appendix appears normal.  There is no ascites, adenopathy, or abscess in the abdomen or pelvis. There is no evidence of abdominal aortic aneurysm. There are no blastic or lytic bone lesions.  IMPRESSION: Diffuse colitis, most likely inflammatory or infectious etiology. There has been overall progression of colitis compared to the previous study with a more generalized pattern of colitis compared to the previous study. There is no evidence of bowel obstruction or free air. Appendix appears normal. No small bowel abnormality appreciable on this study. Study otherwise unremarkable.   Electronically Signed   By: WLowella GripIII  M.D.   On: 11/07/2014 21:30     EKG Interpretation None      MDM   Final diagnoses:  Colitis    Patient here for evaluation of abdominal pain, recent evaluation in the emergency department 2 weeks ago for similar symptoms and has completed a course of ciprofloxacin without improvement in his symptoms. Repeat CT scan obtained to evaluate for evidence of abscess or complicating features. Discussed the case with Gastroenterologist - recommends admission for further evaluation and management.  Discussed with medicine regarding admission.    EQuintella Reichert MD 11/08/14 0010

## 2014-11-08 ENCOUNTER — Encounter (HOSPITAL_COMMUNITY): Payer: Self-pay | Admitting: Gastroenterology

## 2014-11-08 ENCOUNTER — Encounter (HOSPITAL_COMMUNITY)
Admission: EM | Disposition: A | Discharge status: Home / Self Care | Payer: Self-pay | Source: Home / Self Care | Attending: Internal Medicine

## 2014-11-08 DIAGNOSIS — K922 Gastrointestinal hemorrhage, unspecified: Secondary | ICD-10-CM

## 2014-11-08 DIAGNOSIS — E876 Hypokalemia: Secondary | ICD-10-CM

## 2014-11-08 DIAGNOSIS — A09 Infectious gastroenteritis and colitis, unspecified: Secondary | ICD-10-CM

## 2014-11-08 HISTORY — PX: FLEXIBLE SIGMOIDOSCOPY: SHX5431

## 2014-11-08 LAB — BASIC METABOLIC PANEL
Anion gap: 8 (ref 5–15)
BUN: 6 mg/dL (ref 6–23)
CHLORIDE: 102 mmol/L (ref 96–112)
CO2: 24 mmol/L (ref 19–32)
Calcium: 8 mg/dL — ABNORMAL LOW (ref 8.4–10.5)
Creatinine, Ser: 0.88 mg/dL (ref 0.50–1.35)
GFR calc non Af Amer: >90 mL/min (ref >90)
GLUCOSE: 113 mg/dL — AB (ref 70–99)
POTASSIUM: 3.4 mmol/L — AB (ref 3.5–5.1)
Sodium: 134 mmol/L — ABNORMAL LOW (ref 135–145)

## 2014-11-08 LAB — CBC
HEMATOCRIT: 33.7 % — AB (ref 39.0–52.0)
Hemoglobin: 11.8 g/dL — ABNORMAL LOW (ref 13.0–17.0)
MCH: 28.4 pg (ref 26.0–34.0)
MCHC: 35 g/dL (ref 30.0–36.0)
MCV: 81.2 fL (ref 78.0–100.0)
Platelets: 306 10*3/uL (ref 150–400)
RBC: 4.15 MIL/uL — ABNORMAL LOW (ref 4.22–5.81)
RDW: 13.1 % (ref 11.5–15.5)
WBC: 10.3 10*3/uL (ref 4.0–10.5)

## 2014-11-08 LAB — CLOSTRIDIUM DIFFICILE BY PCR: Toxigenic C. Difficile by PCR: NEGATIVE

## 2014-11-08 SURGERY — SIGMOIDOSCOPY, FLEXIBLE
Anesthesia: Moderate Sedation

## 2014-11-08 MED ORDER — PIPERACILLIN-TAZOBACTAM 3.375 G IVPB 30 MIN
3.3750 g | Freq: Once | INTRAVENOUS | Status: AC
  Administered 2014-11-08: 3.375 g via INTRAVENOUS
  Filled 2014-11-08: qty 50

## 2014-11-08 MED ORDER — POTASSIUM CHLORIDE 10 MEQ/100ML IV SOLN
10.0000 meq | INTRAVENOUS | Status: AC
  Filled 2014-11-08 (×3): qty 100

## 2014-11-08 MED ORDER — MIDAZOLAM HCL 10 MG/2ML IJ SOLN
INTRAMUSCULAR | Status: DC | PRN
  Administered 2014-11-08 (×4): 2 mg via INTRAVENOUS

## 2014-11-08 MED ORDER — MESALAMINE 1.2 G PO TBEC
4.8000 g | DELAYED_RELEASE_TABLET | Freq: Every day | ORAL | Status: DC
  Administered 2014-11-09 – 2014-11-11 (×3): 4.8 g via ORAL
  Filled 2014-11-08 (×5): qty 4

## 2014-11-08 MED ORDER — FENTANYL CITRATE 0.05 MG/ML IJ SOLN
INTRAMUSCULAR | Status: DC | PRN
  Administered 2014-11-08 (×3): 25 ug via INTRAVENOUS

## 2014-11-08 MED ORDER — SODIUM CHLORIDE 0.9 % IV SOLN: INTRAVENOUS | Status: DC

## 2014-11-08 MED ORDER — DIPHENHYDRAMINE HCL 50 MG/ML IJ SOLN
INTRAMUSCULAR | Status: AC
  Filled 2014-11-08: qty 1

## 2014-11-08 MED ORDER — FENTANYL CITRATE 0.05 MG/ML IJ SOLN
INTRAMUSCULAR | Status: AC
  Filled 2014-11-08: qty 2

## 2014-11-08 MED ORDER — BOOST / RESOURCE BREEZE PO LIQD
1.0000 | Freq: Three times a day (TID) | ORAL | Status: DC
  Administered 2014-11-08 – 2014-11-11 (×4): 1 via ORAL

## 2014-11-08 MED ORDER — MIDAZOLAM HCL 10 MG/2ML IJ SOLN
INTRAMUSCULAR | Status: AC
  Filled 2014-11-08: qty 2

## 2014-11-08 MED ORDER — PIPERACILLIN-TAZOBACTAM 3.375 G IVPB
3.3750 g | Freq: Three times a day (TID) | INTRAVENOUS | Status: DC
  Administered 2014-11-08 – 2014-11-10 (×7): 3.375 g via INTRAVENOUS
  Filled 2014-11-08 (×7): qty 50

## 2014-11-08 NOTE — Progress Notes (Signed)
ANTIBIOTIC CONSULT NOTE - INITIAL  Pharmacy Consult for zosyn Indication: Intra-abdominal infection  No Known Allergies  Patient Measurements:   Wt=  Vital Signs: Temp: 98.8 F (37.1 C) (03/01 2314) Temp Source: Oral (03/01 2314) BP: 126/60 mmHg (03/01 2314) Pulse Rate: 59 (03/01 2314) Intake/Output from previous day:   Intake/Output from this shift:    Labs:  Recent Labs  11/07/14 1823  WBC 12.4*  HGB 13.1  PLT 329  CREATININE 0.93   CrCl cannot be calculated (Unknown ideal weight.). No results for input(s): VANCOTROUGH, VANCOPEAK, VANCORANDOM, GENTTROUGH, GENTPEAK, GENTRANDOM, TOBRATROUGH, TOBRAPEAK, TOBRARND, AMIKACINPEAK, AMIKACINTROU, AMIKACIN in the last 72 hours.   Microbiology: No results found for this or any previous visit (from the past 720 hour(s)).  Medical History: History reviewed. No pertinent past medical history.  Medications:  Scheduled:  . piperacillin-tazobactam  3.375 g Intravenous Once   Infusions:  . sodium chloride     Assessment: 50 yoM with recent colitis diagnosis, completed 10 day Cipro/Flagyl outpt with minimal relief.  Now with worsening abdominal pain/vomiting/bloody diarrhea.  Zosyn per Rx for intra-abdominal infection.   Goal of Therapy:  Treat infection  Plan:   Zosyn 3.375 gm IV q8h EI  F/u SCr/cultures as needed  Dorrene German 11/08/2014,12:33 AM

## 2014-11-08 NOTE — Progress Notes (Signed)
INITIAL NUTRITION ASSESSMENT  DOCUMENTATION CODES Per approved criteria  -Severe malnutrition in the context of acute illness or injury  Pt meets criteria for severe MALNUTRITION in the context of acute illness as evidenced by 18% wt loss in <1 month and po intake meeting <50% of estimated needs for >5 days.  INTERVENTION: - Please obtain updated height and weight for pt - Diet advancement per MD - Once diet advanced, add Resource Breeze po TID, each supplement provides 250 kcal and 9 grams of protein - Once diet advanced, add Magic cup TID with meals, each supplement provides 290 kcal and 9 grams of protein - RD will continue to monitor  NUTRITION DIAGNOSIS: Inadequate oral intake related to inability to eat as evidenced by NPO.   Goal: Pt to meet >/= 90% of their estimated nutrition needs   Monitor:  Weight trend, diet advancement, labs  Reason for Assessment: Malnutrition Screening Tool  19 y.o. male  Admitting Dx: <principal problem not specified>  ASSESSMENT: 19 y.o. male With no significant past medical history up until 2 weeks ago when he was diagnosed with colitis, He was treated with ten-day course of Cipro and Flagyl with minimal relief only with the pain medicines that he received. 2 days ago started having worsening lower abdominal pain with vomiting and bloody diarrhea. She also reports episodic low-grade fevers and approximately 20-30 pound weight loss in 3 weeks.  - Pt reports his usual body weight is 162 lbs. Yesterday, in the Urgent Care, his weight was 133 lbs. Pt with 29 lb wt loss in the past 3 weeks.  - He reports that his appetite is improving after receiving nausea medication. Pt tolerated broth, jello, and juice this morning. Agreed to try nutritional supplements once diet advanced.  - Labs reviewed.  - No signs of fat or muscle wasting.   Height: Ht Readings from Last 1 Encounters:  No data found for Ht    Weight: Wt Readings from Last 1  Encounters:  No data found for Wt    Wt Readings from Last 10 Encounters:  No data found for Wt    Usual Body Weight: 162 lbs  % Usual Body Weight: 82%  BMI:  There is no height or weight on file to calculate BMI.  Estimated Nutritional Needs: Kcal: 1700-1900 Protein: 90-100 g Fluid: 1.9 L/day  Skin: intact  Diet Order: Diet NPO time specified  EDUCATION NEEDS: -Education needs addressed   Intake/Output Summary (Last 24 hours) at 11/08/14 1101 Last data filed at 11/08/14 1000  Gross per 24 hour  Intake 953.33 ml  Output      0 ml  Net 953.33 ml    Last BM: prior to admission   Labs:   Recent Labs Lab 11/07/14 1823 11/08/14 0455  NA 134* 134*  K 3.8 3.4*  CL 96 102  CO2 24 24  BUN 10 6  CREATININE 0.93 0.88  CALCIUM 8.6 8.0*  GLUCOSE 89 113*    CBG (last 3)  No results for input(s): GLUCAP in the last 72 hours.  Scheduled Meds: . feeding supplement (RESOURCE BREEZE)  1 Container Oral TID BM  . piperacillin-tazobactam (ZOSYN)  IV  3.375 g Intravenous Q8H    Continuous Infusions: . sodium chloride 100 mL/hr at 11/08/14 1053    History reviewed. No pertinent past medical history.  History reviewed. No pertinent past surgical history.  Laurette Schimke [place], [place], [place]

## 2014-11-08 NOTE — Consult Note (Signed)
Reason for Consult: Colitis Referring Physician: Hospital team  Spencer Williams is an 19 y.o. male.  HPI: Patient seen and examined and hospital computer chart reviewed and he has an essentially negative GI history until he was seen in the emergency room a few weeks ago with colitis and was treated with antibiotics but is continued to have problems including bloody diarrhea and abdominal pain but no skin rashes or joint swelling ocular complaints or other complaints and he has not traveled recently but does work in a [employer] but has no sick contacts and has not been on any other medicines prior to this coming on and his family history is negative for any GI problems and he has no other complaints  History reviewed. No pertinent past medical history.  History reviewed. No pertinent past surgical history.  History reviewed. No pertinent family history.  Social History:  reports that he has never smoked. He does not have any smokeless tobacco history on file. He reports that he does not drink alcohol. His drug history is not on file.  Allergies: No Known Allergies  Medications: I have reviewed the patient's current medications.  Results for orders placed or performed during the hospital encounter of 11/07/14 (from the past 48 hour(s))  CBC with Differential     Status: Abnormal   Collection Time: 11/07/14  6:23 PM  Result Value Ref Range   WBC 12.4 (H) 4.0 - 10.5 K/uL   RBC 4.54 4.22 - 5.81 MIL/uL   Hemoglobin 13.1 13.0 - 17.0 g/dL   HCT 37.3 (L) 39.0 - 52.0 %   MCV 82.2 78.0 - 100.0 fL   MCH 28.9 26.0 - 34.0 pg   MCHC 35.1 30.0 - 36.0 g/dL   RDW 13.1 11.5 - 15.5 %   Platelets 329 150 - 400 K/uL   Neutrophils Relative % 78 (H) 43 - 77 %   Lymphocytes Relative 11 (L) 12 - 46 %   Monocytes Relative 9 3 - 12 %   Eosinophils Relative 2 0 - 5 %   Basophils Relative 0 0 - 1 %   Neutro Abs 9.7 (H) 1.7 - 7.7 K/uL   Lymphs Abs 1.4 0.7 - 4.0 K/uL   Monocytes Absolute 1.1 (H) 0.1 -  1.0 K/uL   Eosinophils Absolute 0.2 0.0 - 0.7 K/uL   Basophils Absolute 0.0 0.0 - 0.1 K/uL   WBC Morphology MILD LEFT SHIFT (1-5% METAS, OCC MYELO, OCC BANDS)    Smear Review LARGE PLATELETS PRESENT   Comprehensive metabolic panel     Status: Abnormal   Collection Time: 11/07/14  6:23 PM  Result Value Ref Range   Sodium 134 (L) 135 - 145 mmol/L   Potassium 3.8 3.5 - 5.1 mmol/L   Chloride 96 96 - 112 mmol/L   CO2 24 19 - 32 mmol/L   Glucose, Bld 89 70 - 99 mg/dL   BUN 10 6 - 23 mg/dL   Creatinine, Ser 0.93 0.50 - 1.35 mg/dL   Calcium 8.6 8.4 - 10.5 mg/dL   Total Protein 6.7 6.0 - 8.3 g/dL   Albumin 3.3 (L) 3.5 - 5.2 g/dL   AST 16 0 - 37 U/L   ALT 13 0 - 53 U/L   Alkaline Phosphatase 48 39 - 117 U/L   Total Bilirubin 0.8 0.3 - 1.2 mg/dL   GFR calc non Af Amer >90 >90 mL/min   GFR calc Af Amer >90 >90 mL/min    Comment: (NOTE) The eGFR has been  calculated using the CKD EPI equation. This calculation has not been validated in all clinical situations. eGFR's persistently <90 mL/min signify possible Chronic Kidney Disease.    Anion gap 14 5 - 15  Lipase, blood     Status: None   Collection Time: 11/07/14  6:23 PM  Result Value Ref Range   Lipase 15 11 - 59 U/L  Urinalysis, Routine w reflex microscopic     Status: Abnormal   Collection Time: 11/07/14  6:25 PM  Result Value Ref Range   Color, Urine YELLOW YELLOW   APPearance CLEAR CLEAR   Specific Gravity, Urine 1.031 (H) 1.005 - 1.030   pH 6.0 5.0 - 8.0   Glucose, UA NEGATIVE NEGATIVE mg/dL   Hgb urine dipstick SMALL (A) NEGATIVE   Bilirubin Urine SMALL (A) NEGATIVE   Ketones, ur >80 (A) NEGATIVE mg/dL   Protein, ur 30 (A) NEGATIVE mg/dL   Urobilinogen, UA 0.2 0.0 - 1.0 mg/dL   Nitrite NEGATIVE NEGATIVE   Leukocytes, UA NEGATIVE NEGATIVE  Urine microscopic-add on     Status: None   Collection Time: 11/07/14  6:25 PM  Result Value Ref Range   Squamous Epithelial / LPF RARE RARE   WBC, UA 0-2 <3 WBC/hpf   RBC / HPF 0-2  <3 RBC/hpf  POC occult blood, ED     Status: Abnormal   Collection Time: 11/07/14  8:10 PM  Result Value Ref Range   Fecal Occult Bld POSITIVE (A) NEGATIVE  Clostridium Difficile by PCR     Status: None   Collection Time: 11/08/14  1:06 AM  Result Value Ref Range   C difficile by pcr NEGATIVE NEGATIVE  CBC     Status: Abnormal   Collection Time: 11/08/14  4:55 AM  Result Value Ref Range   WBC 10.3 4.0 - 10.5 K/uL   RBC 4.15 (L) 4.22 - 5.81 MIL/uL   Hemoglobin 11.8 (L) 13.0 - 17.0 g/dL   HCT 33.7 (L) 39.0 - 52.0 %   MCV 81.2 78.0 - 100.0 fL   MCH 28.4 26.0 - 34.0 pg   MCHC 35.0 30.0 - 36.0 g/dL   RDW 13.1 11.5 - 15.5 %   Platelets 306 150 - 400 K/uL  Basic metabolic panel     Status: Abnormal   Collection Time: 11/08/14  4:55 AM  Result Value Ref Range   Sodium 134 (L) 135 - 145 mmol/L   Potassium 3.4 (L) 3.5 - 5.1 mmol/L   Chloride 102 96 - 112 mmol/L   CO2 24 19 - 32 mmol/L   Glucose, Bld 113 (H) 70 - 99 mg/dL   BUN 6 6 - 23 mg/dL   Creatinine, Ser 0.88 0.50 - 1.35 mg/dL   Calcium 8.0 (L) 8.4 - 10.5 mg/dL   GFR calc non Af Amer >90 >90 mL/min   GFR calc Af Amer >90 >90 mL/min    Comment: (NOTE) The eGFR has been calculated using the CKD EPI equation. This calculation has not been validated in all clinical situations. eGFR's persistently <90 mL/min signify possible Chronic Kidney Disease.    Anion gap 8 5 - 15    Ct Abdomen Pelvis W Contrast  11/07/2014   CLINICAL DATA:  Abdominal pain with nausea and vomiting  EXAM: CT ABDOMEN AND PELVIS WITH CONTRAST  TECHNIQUE: Multidetector CT imaging of the abdomen and pelvis was performed using the standard protocol following bolus administration of intravenous contrast. Oral contrast was also administered.  CONTRAST:  141m OMNIPAQUE IOHEXOL 300  MG/ML  SOLN  COMPARISON:  October 23, 2014  FINDINGS: Lung bases are clear.  No focal liver lesions are identified. The gallbladder wall is not appreciably thickened. There is no biliary  duct dilatation.  Spleen, pancreas, and adrenals appear normal. Kidneys bilaterally show no mass or hydronephrosis. There is no renal or ureteral calculus on either side.  In the pelvis, the urinary bladder is midline with normal wall thickness. There is no pelvic mass or pelvic fluid collection.  There is wall thickening throughout much of the colon with hyperemia in the wall. This finding involves virtually all of the colon and upper rectum and is more extensive than on recent prior study.  There is no bowel obstruction. No free air or portal venous air. The appendix appears normal.  There is no ascites, adenopathy, or abscess in the abdomen or pelvis. There is no evidence of abdominal aortic aneurysm. There are no blastic or lytic bone lesions.  IMPRESSION: Diffuse colitis, most likely inflammatory or infectious etiology. There has been overall progression of colitis compared to the previous study with a more generalized pattern of colitis compared to the previous study. There is no evidence of bowel obstruction or free air. Appendix appears normal. No small bowel abnormality appreciable on this study. Study otherwise unremarkable.   Electronically Signed   By: Lowella Grip III M.D.   On: 11/07/2014 21:30    ROS negative except above Blood pressure 119/56, pulse 56, temperature 99.2 F (37.3 C), temperature source Oral, resp. rate 16, SpO2 100 %. Physical Exam vital signs stable afebrile no acute distress lungs are clear heart regular rate and rhythm abdomen is soft nontender labs and CT reviewed stool studies pending  Assessment/Plan: Colitis Plan: The risks benefits methods of flexible sigmoidoscopy and possible colonoscopy were discussed and will proceed later today with further workup and plans pending those findings  [place] E 11/08/2014, 10:05 AM

## 2014-11-08 NOTE — Progress Notes (Addendum)
Patient ID: Spencer Williams, male   DOB: May 01, 1996, 19 y.o.   MRN: [id number] [facility] PROGRESS NOTE  Spencer Williams [id number]:[id number] DOB: 04/14/1996 DOA: 11/07/2014 PCP: No PCP Per Patient  Brief narrative:    19 y.o. male with no significant past medical history, recent diagnosis of colitis and treatment with ten-day course of Cipro and Flagyl. Patient presented to [facility] because of worsening lower abdominal pain, vomiting and bloody diarrhea. Patient also reported having low-grade fevers and weight loss over past 3 weeks prior to this admission, about 20-30 pounds. On admission, patient was hemodynamically stable. CT abdomen showed diffuse colitis most likely inflammatory or infectious with overall progression of colitis compared to prior study. Patient was started on Zosyn on the admission. GI has seen the patient in consultation.   Assessment/Plan:    Principal problem: Abdominal pain, nausea, vomiting and diarrhea / diffuse colitis / leukocytosis - Patient presented with complaints of worsening abdominal pain, nausea, vomiting and diarrhea. CT scan on the admission showed diffuse colitis, inflammatory or infectious in etiology with overall progression of colitis compared to prior studies. - Patient was started on Zosyn on the admission. - Continue nothing by mouth, IV fluids, analgesia and antiemetics as needed. - GI has seen the patient in consultation. Plan is for either flexible sigmoidoscopy or colonoscopy today.  Active problems: Hypokalemia - Likely secondary to GI losses. - Supplemented.  Bloody diarrhea / lower GI bleed / acute blood loss anemia - Hemoglobin on admission was 13.1 and this morning is 11.8. Likely reflective of colitis. - Plan for evaluation by flexible sigmoidoscopy or colonoscopy per GI recommendations. - C. difficile negative. GI pathogen panel is pending.   DVT Prophylaxis  - SCDs bilaterally   Code Status: Full.  Family  Communication:  plan of care discussed with the patient Disposition Plan: Patient on IV antibiotics for treatment of colitis. Plan is for either flexible sigmoidoscopy or colonoscopy by GI today.  IV access:  Peripheral IV  Procedures and diagnostic studies:    Ct Abdomen Pelvis W Contrast 11/07/2014  Diffuse colitis, most likely inflammatory or infectious etiology. There has been overall progression of colitis compared to the previous study with a more generalized pattern of colitis compared to the previous study. There is no evidence of bowel obstruction or free air. Appendix appears normal. No small bowel abnormality appreciable on this study. Study otherwise unremarkable.    Medical Consultants:  Gastroenterology, Dr. Clarene Essex   Other Consultants:  None   IAnti-Infectives:   Zosyn 11/08/2014 -->   Leisa Lenz, MD  [facility] Pager [phone]  If 7PM-7AM, please contact night-coverage [email or web address] Password [email or web address] 11/08/2014, 11:04 AM   LOS: 1 day    HPI/Subjective: No acute overnight events.  Objective: Filed Vitals:   11/07/14 1918 11/07/14 2134 11/07/14 2314 11/08/14 0501  BP: 121/56 113/55 126/60 119/56  Pulse: 95 70 59 56  Temp:   98.8 F (37.1 C) 99.2 F (37.3 C)  TempSrc:   Oral Oral  Resp: 14 20 16 16   SpO2: 98% 100% 98% 100%    Intake/Output Summary (Last 24 hours) at 11/08/14 1104 Last data filed at 11/08/14 1000  Gross per 24 hour  Intake 953.33 ml  Output      0 ml  Net 953.33 ml    Exam:   General:  Pt is alert, follows commands appropriately, not in acute distress  Cardiovascular: Regular rate and rhythm, S1/S2, no murmurs  Respiratory: Clear to auscultation bilaterally, no wheezing,  no crackles, no rhonchi  Abdomen: tender to palpation in mid abdomen, non distended, bowel sounds present  Extremities: No edema, pulses DP and PT palpable bilaterally  Neuro: Grossly nonfocal  Data Reviewed: Basic Metabolic Panel:  Recent  Labs Lab 11/07/14 1823 11/08/14 0455  NA 134* 134*  K 3.8 3.4*  CL 96 102  CO2 24 24  GLUCOSE 89 113*  BUN 10 6  CREATININE 0.93 0.88  CALCIUM 8.6 8.0*   Liver Function Tests:  Recent Labs Lab 11/07/14 1823  AST 16  ALT 13  ALKPHOS 48  BILITOT 0.8  PROT 6.7  ALBUMIN 3.3*    Recent Labs Lab 11/07/14 1823  LIPASE 15   No results for input(s): AMMONIA in the last 168 hours. CBC:  Recent Labs Lab 11/07/14 1823 11/08/14 0455  WBC 12.4* 10.3  NEUTROABS 9.7*  --   HGB 13.1 11.8*  HCT 37.3* 33.7*  MCV 82.2 81.2  PLT 329 306   Cardiac Enzymes: No results for input(s): CKTOTAL, CKMB, CKMBINDEX, TROPONINI in the last 168 hours. BNP: Invalid input(s): POCBNP CBG: No results for input(s): GLUCAP in the last 168 hours.  Clostridium Difficile by PCR     Status: None   Collection Time: 11/08/14  1:06 AM  Result Value Ref Range Status   C difficile by pcr NEGATIVE NEGATIVE Final     Scheduled Meds: . feeding supplement (RESOURCE BREEZE)  1 Container Oral TID BM  . piperacillin-tazobactam (ZOSYN)  IV  3.375 g Intravenous Q8H   Continuous Infusions: . sodium chloride 100 mL/hr at 11/08/14 1053

## 2014-11-08 NOTE — Op Note (Signed)
[facility] [place], [id number]   COLONOSCOPY PROCEDURE REPORT     EXAM DATE: 11-12-2014  PATIENT NAME:      Spencer Williams, Spencer Williams           MR #:      [id number]  BIRTHDATE:       [birth year]      VISIT #:     [id number] ATTENDING:     Clarene Essex, MD     STATUS:     inpatient REFERRING MD: ASA CLASS:        Class I  INDICATIONS:  The patient is a 19 yr old male here for a colonoscopy due to an abnormal CT, hematochezia, unexplained diarrhea, and abdominal pain. PROCEDURE PERFORMED:     Colonoscopy with biopsy MEDICATIONS:     Fentanyl 75 mcg IV and Versed 8 mg IV ESTIMATED BLOOD LOSS:     None  CONSENT: The patient understands the risks and benefits of the procedure and understands that these risks include, but are not limited to: sedation, allergic reaction, infection, perforation and/or bleeding. Alternative means of evaluation and treatment include, among others: physical exam, x-rays, and/or surgical intervention. The patient elects to proceed with this endoscopic procedure.  DESCRIPTION OF PROCEDURE: During intra-op preparation period all mechanical & medical equipment was checked for proper function. Hand hygiene and appropriate measures for infection prevention was taken. After the risks, benefits and alternatives of the procedure were thoroughly explained, Informed consent was verified, confirmed and timeout was successfully executed by the treatment team. A digital exam revealed external hemorrhoids.      The Pentax Ped Colon [id number] endoscope was introduced through the anus and advanced to the terminal ileum which was intubated for a short distance. No adverse events experienced. The prep was adequate for the purpose of this exam Despite being unprepped. The instrument was then slowly withdrawn as the colon was fully examined. the findings are recorded below      Retroflexed views revealed internal hemorrhoids.  The scope was then completely  withdrawn from the patient and the procedure terminated. WITHDRAWAL TIME: see nurse's note    ADVERSE EVENTS:      There were no immediate complications.  IMPRESSIONS:     1. Small internal and external hemorrhoids 2. Moderate colitis throughout rectum left and right colon with some cecal sparing status post biopsy 3. Otherwise within normal limits to the terminal ileum status post GI biopsy as well  RECOMMENDATIONS:     Will go ahead and begin a 5-ASA and expect the biopsies will show ulcerative colitis and if so would begin prednisone at that point and we will allow clear liquids and hopefully he'll be able to go home soon with close GI follow-up RECALL:     as needed  Clarene Essex, MD eSigned:  Clarene Essex, MD 11/12/2014 2:01 PM   cc:  CPT CODES: ICD CODES:  The ICD and CPT codes recommended by this software are interpretations from the data that the clinical staff has captured with the software.  The verification of the translation of this report to the ICD and CPT codes and modifiers is the sole responsibility of the health care institution and practicing physician where this report was generated.  [place]. will not be held responsible for the validity of the ICD and CPT codes included on this report.  AMA assumes no liability for data contained or not contained herein. CPT is a Designer, television/film set of the  American Medical Association.   PATIENT NAME:  [name], [name] MR#: [id number]

## 2014-11-09 ENCOUNTER — Encounter (HOSPITAL_COMMUNITY): Payer: Self-pay | Admitting: Gastroenterology

## 2014-11-09 DIAGNOSIS — E43 Unspecified severe protein-calorie malnutrition: Secondary | ICD-10-CM

## 2014-11-09 LAB — BASIC METABOLIC PANEL
Anion gap: 8 (ref 5–15)
CHLORIDE: 98 mmol/L (ref 96–112)
CO2: 27 mmol/L (ref 19–32)
Calcium: 7.7 mg/dL — ABNORMAL LOW (ref 8.4–10.5)
Creatinine, Ser: 0.95 mg/dL (ref 0.50–1.35)
Glucose, Bld: 103 mg/dL — ABNORMAL HIGH (ref 70–99)
POTASSIUM: 3.3 mmol/L — AB (ref 3.5–5.1)
SODIUM: 133 mmol/L — AB (ref 135–145)

## 2014-11-09 MED ORDER — BUDESONIDE 3 MG PO CP24
9.0000 mg | ORAL_CAPSULE | Freq: Every day | ORAL | Status: DC
  Administered 2014-11-09 – 2014-11-11 (×3): 9 mg via ORAL
  Filled 2014-11-09 (×3): qty 3

## 2014-11-09 MED ORDER — POTASSIUM CHLORIDE CRYS ER 20 MEQ PO TBCR
40.0000 meq | EXTENDED_RELEASE_TABLET | Freq: Once | ORAL | Status: AC
  Administered 2014-11-09: 40 meq via ORAL
  Filled 2014-11-09: qty 2

## 2014-11-09 NOTE — Progress Notes (Signed)
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

## 2014-11-09 NOTE — Progress Notes (Addendum)
Patient ID: Isaias Dowson, male   DOB: October 23, 1995, 19 y.o.   MRN: [id number] [facility] PROGRESS NOTE  Kelyn Koskela UKG:[id number] DOB: 14-Dec-1995 DOA: 11/07/2014 PCP: No PCP Per Patient  Brief narrative:    19 y.o. male with no significant past medical history, recent diagnosis of colitis and treatment with ten-day course of Cipro and Flagyl. Patient presented to [facility] long hospital because of worsening lower abdominal pain, vomiting and bloody diarrhea. Patient also reported having low-grade fevers and weight loss over past 3 weeks prior to this admission, about 20-30 pounds. On admission, patient was hemodynamically stable. CT abdomen showed diffuse colitis most likely inflammatory or infectious with overall progression of colitis compared to prior study. Patient was started on Zosyn on the admission. GI has seen the patient in consultation. Patient underwent colonoscopy 11/08/2014. Findings consistent with pretty much diffuse colitis except for areas of cecal sparing.  Assessment/Plan:     Principal problem: Abdominal pain, nausea, vomiting and diarrhea / diffuse colitis / leukocytosis - Patient presented with complaints of worsening abdominal pain, nausea, vomiting and diarrhea.  - CT scan on the admission showed diffuse colitis, inflammatory or infectious in etiology with overall progression of colitis compared to prior studies. - Patient was started on Zosyn on the admission. - Patient underwent colonoscopy 11/08/2014. Findings were consistent with prematurity much diffuse colitis but there were areas of cecal sparing. Per GI, started mesalamine daily. - Advance diet. - Continue pain management efforts.  Active problems: Hypokalemia - Likely secondary to GI losses. - Supplemented.  Bloody diarrhea / lower GI bleed / acute blood loss anemia - Hemoglobin drop noted from 13.1 down to 11.8. Likely secondary to colitis. - C. difficile is negative. GI pathogen panel pending. -  No further reports of bleeding. - Follow-up CBC tomorrow morning.  Severe protein calorie malnutrition - In the context of acute colitis. Nutrition consulted. Continue nutritional supplementation.   DVT Prophylaxis  - SCDs bilaterally due to risk of bleeding.   Code Status: Full.  Family Communication:  plan of care discussed with the patient Disposition Plan: Continue current IV antibiotics. Diet slowly advanced. Not stable for discharge yet.  IV access:  Peripheral IV  Procedures and diagnostic studies:    Ct Abdomen Pelvis W Contrast 11/07/2014  Diffuse colitis, most likely inflammatory or infectious etiology. There has been overall progression of colitis compared to the previous study with a more generalized pattern of colitis compared to the previous study. There is no evidence of bowel obstruction or free air. Appendix appears normal. No small bowel abnormality appreciable on this study. Study otherwise unremarkable.    Colonoscopy 11/08/2014 - Small internal and external hemorrhoids 2.Moderate colitis throughout rectum left and right colon with some cecal sparing status post biopsy 3. Otherwise within normal limitsto the terminal ileum status post GI biopsy as well  Medical Consultants:  Gastroenterology, Dr. Clarene Essex   Other Consultants:  None   IAnti-Infectives:   Zosyn 11/08/2014 -->    Leisa Lenz, MD  [facility] Pager [phone]  If 7PM-7AM, please contact night-coverage [email or web address] Password [email or web address] 11/09/2014, 11:06 AM   LOS: 2 days    HPI/Subjective: No acute overnight events.  Objective: Filed Vitals:   11/08/14 1517 11/08/14 2133 11/09/14 0519 11/09/14 0807  BP: 137/59 138/60 124/54 121/60  Pulse: 73 102 103 60  Temp: 98.2 F (36.8 C) 99.2 F (37.3 C) 100.6 F (38.1 C) 97.8 F (36.6 C)  TempSrc: Oral Oral Oral Oral  Resp: 16 16 16  16  Height:   5' 7"  (1.702 m)   Weight:   59.829 kg (131 lb 14.4 oz)   SpO2: 100% 100% 99% 98%     Intake/Output Summary (Last 24 hours) at 11/09/14 1106 Last data filed at 11/09/14 0900  Gross per 24 hour  Intake   1440 ml  Output    300 ml  Net   1140 ml    Exam:   General:  Pt is not in acute distress  Cardiovascular: Regular rate and rhythm, S1/S2 (+)  Respiratory: Bilateral air entry, no wheezing  Abdomen: Soft, non tender, non distended, bowel sounds present  Extremities: No leg swelling, pulses palpable  Neuro: Grossly nonfocal  Data Reviewed: Basic Metabolic Panel:  Recent Labs Lab 11/07/14 1823 11/08/14 0455 11/09/14 0424  NA 134* 134* 133*  K 3.8 3.4* 3.3*  CL 96 102 98  CO2 24 24 27   GLUCOSE 89 113* 103*  BUN 10 6 <5*  CREATININE 0.93 0.88 0.95  CALCIUM 8.6 8.0* 7.7*   Liver Function Tests:  Recent Labs Lab 11/07/14 1823  AST 16  ALT 13  ALKPHOS 48  BILITOT 0.8  PROT 6.7  ALBUMIN 3.3*    Recent Labs Lab 11/07/14 1823  LIPASE 15   No results for input(s): AMMONIA in the last 168 hours. CBC:  Recent Labs Lab 11/07/14 1823 11/08/14 0455  WBC 12.4* 10.3  NEUTROABS 9.7*  --   HGB 13.1 11.8*  HCT 37.3* 33.7*  MCV 82.2 81.2  PLT 329 306   Cardiac Enzymes: No results for input(s): CKTOTAL, CKMB, CKMBINDEX, TROPONINI in the last 168 hours. BNP: Invalid input(s): POCBNP CBG: No results for input(s): GLUCAP in the last 168 hours.  Recent Results (from the past 240 hour(s))  Clostridium Difficile by PCR     Status: None   Collection Time: 11/08/14  1:06 AM  Result Value Ref Range Status   C difficile by pcr NEGATIVE NEGATIVE Final     Scheduled Meds: . feeding supplement (RESOURCE BREEZE)  1 Container Oral TID BM  . mesalamine  4.8 g Oral Q breakfast  . piperacillin-tazobactam (ZOSYN)  IV  3.375 g Intravenous Q8H  . potassium chloride  40 mEq Oral Once   Continuous Infusions: . sodium chloride 100 mL (11/08/14 1950)

## 2014-11-10 DIAGNOSIS — R110 Nausea: Secondary | ICD-10-CM

## 2014-11-10 LAB — BASIC METABOLIC PANEL
ANION GAP: 5 (ref 5–15)
BUN: <5 mg/dL — ABNORMAL LOW (ref 6–23)
CALCIUM: 8 mg/dL — AB (ref 8.4–10.5)
CO2: 29 mmol/L (ref 19–32)
Chloride: 104 mmol/L (ref 96–112)
Creatinine, Ser: 0.73 mg/dL (ref 0.50–1.35)
GFR calc Af Amer: >90 mL/min (ref >90)
GLUCOSE: 98 mg/dL (ref 70–99)
POTASSIUM: 3.7 mmol/L (ref 3.5–5.1)
SODIUM: 138 mmol/L (ref 135–145)

## 2014-11-10 LAB — GI PATHOGEN PANEL BY PCR, STOOL
C difficile toxin A/B: NOT DETECTED
Campylobacter by PCR: NOT DETECTED
Cryptosporidium by PCR: NOT DETECTED
E COLI 0157 BY PCR: NOT DETECTED
E coli (ETEC) LT/ST: NOT DETECTED
E coli (STEC): NOT DETECTED
G lamblia by PCR: NOT DETECTED
NOROVIRUS G1/G2: NOT DETECTED
Rotavirus A by PCR: NOT DETECTED
SALMONELLA BY PCR: NOT DETECTED
SHIGELLA BY PCR: NOT DETECTED

## 2014-11-10 LAB — CBC
HCT: 31.8 % — ABNORMAL LOW (ref 39.0–52.0)
Hemoglobin: 10.8 g/dL — ABNORMAL LOW (ref 13.0–17.0)
MCH: 28.1 pg (ref 26.0–34.0)
MCHC: 34 g/dL (ref 30.0–36.0)
MCV: 82.8 fL (ref 78.0–100.0)
PLATELETS: 258 10*3/uL (ref 150–400)
RBC: 3.84 MIL/uL — ABNORMAL LOW (ref 4.22–5.81)
RDW: 13.3 % (ref 11.5–15.5)
WBC: 7.6 10*3/uL (ref 4.0–10.5)

## 2014-11-10 MED ORDER — BOOST / RESOURCE BREEZE PO LIQD: 1.0000 | Freq: Three times a day (TID) | ORAL | Status: AC

## 2014-11-10 MED ORDER — BUDESONIDE 3 MG PO CP24: 9.0000 mg | ORAL_CAPSULE | Freq: Every day | ORAL | Status: DC

## 2014-11-10 MED ORDER — INFLUENZA VAC SPLIT QUAD 0.5 ML IM SUSY
0.5000 mL | PREFILLED_SYRINGE | INTRAMUSCULAR | Status: AC
  Administered 2014-11-11: 0.5 mL via INTRAMUSCULAR
  Filled 2014-11-10 (×2): qty 0.5

## 2014-11-10 MED ORDER — OXYCODONE-ACETAMINOPHEN 5-325 MG PO TABS: 1.0000 | ORAL_TABLET | Freq: Four times a day (QID) | ORAL | Status: DC | PRN

## 2014-11-10 NOTE — Progress Notes (Signed)
Spoke with Dr. Charlies Silvers who questioned sending pt home today. Pt states that he feels well enough to be discharged. Discharge orders to be received mid afternoon time. Pt to be discharged around 1600 today.

## 2014-11-10 NOTE — Progress Notes (Signed)
Spencer Williams 9:31 AM  Subjective: Patient doing about the same and we discussed his pathology and ulcerative colitis as well as to medicines to include first-line 5-ASA and steroids and the risk of prednisone were discussed versus Entocort which we have him on now and having social services see if he qualifies for any pharmacy benefits or Medicaid is important and we discussed low residue diet as well  Objective: Vital signs stable afebrile no acute distress abdomen is soft nontender labs stable  Assessment: Ulcerative colitis  Plan: Believe patient could go home soon and you can stop IV antibiotics and he does not need daily labs and can come by my office to get samples of 5-ASA and follow-up with me in a few weeks and see above for getting social services involved and unfortunately due to cost will probably need to use prednisone as well unless we can get Entocort and please call me if I can be of any further assistance this hospital stay  [facility]

## 2014-11-10 NOTE — Discharge Instructions (Signed)
Colitis Colitis is inflammation of the colon. Colitis can be a short-term or long-standing (chronic) illness. Crohn's disease and ulcerative colitis are 2 types of colitis which are chronic. They usually require lifelong treatment. CAUSES  There are many different causes of colitis, including:  Viruses.  Germs (bacteria).  Medicine reactions. SYMPTOMS   Diarrhea.  Intestinal bleeding.  Pain.  Fever.  Throwing up (vomiting).  Tiredness (fatigue).  Weight loss.  Bowel blockage. DIAGNOSIS  The diagnosis of colitis is based on examination and stool or blood tests. X-rays, CT scan, and colonoscopy may also be needed. TREATMENT  Treatment may include:  Fluids given through the vein (intravenously).  Bowel rest (nothing to eat or drink for a period of time).  Medicine for pain and diarrhea.  Medicines (antibiotics) that kill germs.  Cortisone medicines.  Surgery. HOME CARE INSTRUCTIONS   Get plenty of rest.  Drink enough water and fluids to keep your urine clear or pale yellow.  Eat a well-balanced diet.  Call your caregiver for follow-up as recommended. SEEK IMMEDIATE MEDICAL CARE IF:   You develop chills.  You have an oral temperature above 102 F (38.9 C), not controlled by medicine.  You have extreme weakness, fainting, or dehydration.  You have repeated vomiting.  You develop severe belly (abdominal) pain or are passing bloody or tarry stools. MAKE SURE YOU:   Understand these instructions.  Will watch your condition.  Will get help right away if you are not doing well or get worse. Document Released: 10/02/2004 Document Revised: 11/17/2011 Document Reviewed: 12/28/2009 [facility] Patient Information 2015 [place], [place]. This information is not intended to replace advice given to you by your health care provider. Make sure you discuss any questions you have with your health care provider.

## 2014-11-10 NOTE — Discharge Summary (Signed)
Physician Discharge Summary  Spencer Williams WFU:[id number] DOB: Oct 31, 1995 DOA: 11/07/2014  PCP: No PCP Per Patient  Admit date: 11/07/2014 Discharge date: 11/10/2014  Recommendations for Outpatient Follow-up:  1. Please note new medication budesonide 2. Follow up on biopsy from colonoscopy 3. Pt aware of possible new diagnosis of ulcerative colitis   Discharge Diagnoses:  Active Problems:   Colitis   Protein-calorie malnutrition, severe    Discharge Condition: stable   Diet recommendation: as tolerated   History of present illness:  19 y.o. male with no significant past medical history, recent diagnosis of colitis and treatment with ten-day course of Cipro and Flagyl. Patient presented to [facility] long hospital because of worsening lower abdominal pain, vomiting and bloody diarrhea. Patient also reported having low-grade fevers and weight loss over past 3 weeks prior to this admission, about 20-30 pounds. On admission, patient was hemodynamically stable. CT abdomen showed diffuse colitis most likely inflammatory or infectious with overall progression of colitis compared to prior study. Patient was started on Zosyn on the admission. GI has seen the patient in consultation. Patient underwent colonoscopy 11/08/2014. Findings consistent with pretty much diffuse colitis except for areas of cecal sparing.  Assessment/Plan:     Principal problem: Abdominal pain, nausea, vomiting and diarrhea / diffuse colitis / leukocytosis - Patient presented with complaints of worsening abdominal pain, nausea, vomiting and diarrhea.  - CT scan on the admission showed diffuse colitis, inflammatory or infectious in etiology with overall progression of colitis compared to prior studies. Patient started on Zosyn on the admission. - Status post colonoscopy 11/08/2014. Findings were consistent with diffuse colitis but there were areas of cecal sparing. Per GI, started mesalamine daily. Looks like ulcerative  colitis but will need to f/u on bx results. - Soft diet, tolerates well  Active problems: Hypokalemia - Likely secondary to GI losses. - Repleted.  Bloody diarrhea / lower GI bleed / acute blood loss anemia - Hemoglobin drop noted from 13.1 down to 11.8. Likely secondary to colitis. - C. difficile is negative. GI pathogen panel is still pending as of today 3/4. Outpt follow up. - No further reports of bleeding.  Severe protein calorie malnutrition - In the context of acute colitis. Nutrition consulted. Continue nutritional supplementation.   DVT Prophylaxis  - SCDs bilaterally while in hospital    Code Status: Full.  Family Communication: plan of care discussed with the patient   IV access:  Peripheral IV  Procedures and diagnostic studies:   Ct Abdomen Pelvis W Contrast 11/07/2014 Diffuse colitis, most likely inflammatory or infectious etiology. There has been overall progression of colitis compared to the previous study with a more generalized pattern of colitis compared to the previous study. There is no evidence of bowel obstruction or free air. Appendix appears normal. No small bowel abnormality appreciable on this study. Study otherwise unremarkable.   Colonoscopy 11/08/2014 - Small internal and external hemorrhoids 2.Moderate colitis throughout rectum left and right colon with some cecal sparing status post biopsy 3. Otherwise within normal limitsto the terminal ileum status post GI biopsy as well  Medical Consultants:  Gastroenterology, Dr. Clarene Essex   Other Consultants:  None   IAnti-Infectives:   Zosyn 11/08/2014 --> 11/10/2014    Signed:  Leisa Lenz, MD  [facility] 11/10/2014, 3:44 PM  Pager #: [phone]   Discharge Exam: Filed Vitals:   11/10/14 1434  BP: 120/58  Pulse: 92  Temp: 97.9 F (36.6 C)  Resp: 18   Filed Vitals:   11/09/14 1700  11/09/14 2130 11/10/14 0600 11/10/14 1434  BP:  100/55 107/50 120/58  Pulse:   81 64 92  Temp: 100.9 F (38.3 C) 98.2 F (36.8 C) 97.9 F (36.6 C) 97.9 F (36.6 C)  TempSrc: Axillary Oral Oral Oral  Resp:  16 16 18   Height:      Weight:      SpO2:  99% 100% 100%    General: Pt is alert, follows commands appropriately, not in acute distress Cardiovascular: Regular rate and rhythm, S1/S2 +, no murmurs Respiratory: Clear to auscultation bilaterally, no wheezing, no crackles, no rhonchi Abdominal: Soft, non tender, non distended, bowel sounds +, no guarding Extremities: no edema, no cyanosis, pulses palpable bilaterally DP and PT Neuro: Grossly nonfocal  Discharge Instructions  Discharge Instructions    Call MD for:  difficulty breathing, headache or visual disturbances    Complete by:  As directed      Call MD for:  persistant nausea and vomiting    Complete by:  As directed      Call MD for:  severe uncontrolled pain    Complete by:  As directed      Diet - low sodium heart healthy    Complete by:  As directed      Increase activity slowly    Complete by:  As directed             Medication List    STOP taking these medications        ciprofloxacin 500 MG tablet  Commonly known as:  CIPRO     HYDROcodone-acetaminophen 5-325 MG per tablet  Commonly known as:  NORCO/VICODIN      TAKE these medications        budesonide 3 MG 24 hr capsule  Commonly known as:  ENTOCORT EC  Take 3 capsules (9 mg total) by mouth daily.     feeding supplement (RESOURCE BREEZE) Liqd  Take 1 Container by mouth 3 (three) times daily between meals.     oxyCODONE-acetaminophen 5-325 MG per tablet  Commonly known as:  PERCOCET  Take 1 tablet by mouth every 6 (six) hours as needed for moderate pain.          The results of significant diagnostics from this hospitalization (including imaging, microbiology, ancillary and laboratory) are listed below for reference.    Significant Diagnostic Studies: Ct Abdomen Pelvis W Contrast  11/07/2014   CLINICAL DATA:   Abdominal pain with nausea and vomiting  EXAM: CT ABDOMEN AND PELVIS WITH CONTRAST  TECHNIQUE: Multidetector CT imaging of the abdomen and pelvis was performed using the standard protocol following bolus administration of intravenous contrast. Oral contrast was also administered.  CONTRAST:  146m OMNIPAQUE IOHEXOL 300 MG/ML  SOLN  COMPARISON:  October 23, 2014  FINDINGS: Lung bases are clear.  No focal liver lesions are identified. The gallbladder wall is not appreciably thickened. There is no biliary duct dilatation.  Spleen, pancreas, and adrenals appear normal. Kidneys bilaterally show no mass or hydronephrosis. There is no renal or ureteral calculus on either side.  In the pelvis, the urinary bladder is midline with normal wall thickness. There is no pelvic mass or pelvic fluid collection.  There is wall thickening throughout much of the colon with hyperemia in the wall. This finding involves virtually all of the colon and upper rectum and is more extensive than on recent prior study.  There is no bowel obstruction. No free air or portal venous air. The appendix appears normal.  There is no ascites, adenopathy, or abscess in the abdomen or pelvis. There is no evidence of abdominal aortic aneurysm. There are no blastic or lytic bone lesions.  IMPRESSION: Diffuse colitis, most likely inflammatory or infectious etiology. There has been overall progression of colitis compared to the previous study with a more generalized pattern of colitis compared to the previous study. There is no evidence of bowel obstruction or free air. Appendix appears normal. No small bowel abnormality appreciable on this study. Study otherwise unremarkable.   Electronically Signed   By: Lowella Grip III M.D.   On: 11/07/2014 21:30   Ct Abdomen Pelvis W Contrast  10/23/2014   CLINICAL DATA:  Lower abdominal pain and diarrhea for 3 days.  EXAM: CT ABDOMEN AND PELVIS WITH CONTRAST  TECHNIQUE: Multidetector CT imaging of the abdomen  and pelvis was performed using the standard protocol following bolus administration of intravenous contrast.  CONTRAST:  100 mL OMNIPAQUE IOHEXOL 300 MG/ML  SOLN  COMPARISON:  None.  FINDINGS: The lung bases are clear no pleural or pericardial effusion.  There is thickening of the walls of the colon, particularly the ascending and transverse. The terminal ileum and remainder of the small bowel appear normal. The stomach is unremarkable. No fluid collection is seen. Small right lower quadrant lymph nodes are noted and likely reactive. The appendix is normal in appearance. There is no pneumatosis, portal venous gas, fluid collection for free air.  The gallbladder, liver, spleen, adrenal glands, pancreas and kidneys appear normal. No focal bony abnormality is identified.  IMPRESSION: Findings compatible with infectious or inflammatory colitis most notable in the ascending and transverse colon. The examination is otherwise negative.   Electronically Signed   By: Inge Rise M.D.   On: 10/23/2014 21:02    Microbiology: Recent Results (from the past 240 hour(s))  Clostridium Difficile by PCR     Status: None   Collection Time: 11/08/14  1:06 AM  Result Value Ref Range Status   C difficile by pcr NEGATIVE NEGATIVE Final     Labs: Basic Metabolic Panel:  Recent Labs Lab 11/07/14 1823 11/08/14 0455 11/09/14 0424 11/10/14 0502  NA 134* 134* 133* 138  K 3.8 3.4* 3.3* 3.7  CL 96 102 98 104  CO2 24 24 27 29   GLUCOSE 89 113* 103* 98  BUN 10 6 <5* <5*  CREATININE 0.93 0.88 0.95 0.73  CALCIUM 8.6 8.0* 7.7* 8.0*   Liver Function Tests:  Recent Labs Lab 11/07/14 1823  AST 16  ALT 13  ALKPHOS 48  BILITOT 0.8  PROT 6.7  ALBUMIN 3.3*    Recent Labs Lab 11/07/14 1823  LIPASE 15   No results for input(s): AMMONIA in the last 168 hours. CBC:  Recent Labs Lab 11/07/14 1823 11/08/14 0455 11/10/14 0502  WBC 12.4* 10.3 7.6  NEUTROABS 9.7*  --   --   HGB 13.1 11.8* 10.8*  HCT 37.3*  33.7* 31.8*  MCV 82.2 81.2 82.8  PLT 329 306 258   Cardiac Enzymes: No results for input(s): CKTOTAL, CKMB, CKMBINDEX, TROPONINI in the last 168 hours. BNP: BNP (last 3 results) No results for input(s): BNP in the last 8760 hours.  ProBNP (last 3 results) No results for input(s): PROBNP in the last 8760 hours.  CBG: No results for input(s): GLUCAP in the last 168 hours.  Time coordinating discharge: Over 30 minutes

## 2014-11-10 NOTE — Progress Notes (Signed)
Pt stated that he was having some abd cramping, was nervous about going home and having the symptoms again and wanted to stay one night.  Dr. Charlies Silvers paged approx [phone]. No call or oders rec'd.  Walden Field paged at [phone] to update  on pt's status.  Awaiting call

## 2014-11-11 NOTE — Discharge Summary (Signed)
Reviewed discharge instructions including follow-up appointment, medication, and precautions.  Pt verbalized understanding of all topics discussed.  Waiting for ride later this afternoon.  Planning to be d/c into care of aunt or uncle.

## 2014-11-11 NOTE — Progress Notes (Signed)
Pt feels fairly well, much better than on admission.  VSS.  Mild abd tend, w/out distension.  Mild/mod tympany.  IMPR/PLAN:  Plans for dischg noted, agree.  D/w pt re diff between Entocort and systemic steroids.  If Entocort not affordable for pt, it is reasonable to skip tomorrow's dose and contact Dr. Watt Climes on Monday (has his card), to see if we can get samples.  Don't think we need to start systemic steroids at this time.  Pt knows to call us if Sx worsen significantly over wkend.  Cleotis Nipper, M.D. [phone]

## 2014-11-11 NOTE — Progress Notes (Signed)
No overnight events. Pt to be discharged today. Leisa Lenz [facility] [phone]

## 2014-11-11 NOTE — Progress Notes (Signed)
Night nurse, Rip Harbour, received call from Dr. Charlies Silvers while giving report to me.  Rip Harbour reported that Dr. Charlies Silvers said to d/c pt today.

## 2014-11-19 ENCOUNTER — Encounter (HOSPITAL_COMMUNITY): Payer: Self-pay

## 2014-11-19 ENCOUNTER — Emergency Department (HOSPITAL_COMMUNITY)
Admission: EM | Admit: 2014-11-19 | Discharge: 2014-11-19 | Disposition: A | Discharge status: Home / Self Care | Payer: Medicaid Other | Attending: Emergency Medicine | Admitting: Emergency Medicine

## 2014-11-19 DIAGNOSIS — R1110 Vomiting, unspecified: Secondary | ICD-10-CM | POA: Diagnosis present

## 2014-11-19 DIAGNOSIS — Z79899 Other long term (current) drug therapy: Secondary | ICD-10-CM | POA: Insufficient documentation

## 2014-11-19 DIAGNOSIS — K5180 Other ulcerative colitis without complications: Secondary | ICD-10-CM | POA: Insufficient documentation

## 2014-11-19 DIAGNOSIS — K5190 Ulcerative colitis, unspecified, without complications: Secondary | ICD-10-CM

## 2014-11-19 HISTORY — DX: Ulcerative colitis, unspecified, without complications: K51.90

## 2014-11-19 LAB — CBC
HEMATOCRIT: 33.7 % — AB (ref 39.0–52.0)
Hemoglobin: 11.6 g/dL — ABNORMAL LOW (ref 13.0–17.0)
MCH: 28 pg (ref 26.0–34.0)
MCHC: 34.4 g/dL (ref 30.0–36.0)
MCV: 81.2 fL (ref 78.0–100.0)
PLATELETS: 345 10*3/uL (ref 150–400)
RBC: 4.15 MIL/uL — ABNORMAL LOW (ref 4.22–5.81)
RDW: 12.8 % (ref 11.5–15.5)
WBC: 10 10*3/uL (ref 4.0–10.5)

## 2014-11-19 LAB — COMPREHENSIVE METABOLIC PANEL
ALK PHOS: 47 U/L (ref 39–117)
ALT: 13 U/L (ref 0–53)
AST: 14 U/L (ref 0–37)
Albumin: 2.2 g/dL — ABNORMAL LOW (ref 3.5–5.2)
Anion gap: 6 (ref 5–15)
BILIRUBIN TOTAL: 0.6 mg/dL (ref 0.3–1.2)
BUN: 7 mg/dL (ref 6–23)
CALCIUM: 8 mg/dL — AB (ref 8.4–10.5)
CO2: 31 mmol/L (ref 19–32)
Chloride: 91 mmol/L — ABNORMAL LOW (ref 96–112)
Creatinine, Ser: 0.78 mg/dL (ref 0.50–1.35)
GFR calc Af Amer: >90 mL/min (ref >90)
GLUCOSE: 117 mg/dL — AB (ref 70–99)
POTASSIUM: 3.8 mmol/L (ref 3.5–5.1)
SODIUM: 128 mmol/L — AB (ref 135–145)
TOTAL PROTEIN: 6.6 g/dL (ref 6.0–8.3)

## 2014-11-19 MED ORDER — SODIUM CHLORIDE 0.9 % IV BOLUS (SEPSIS)
1000.0000 mL | Freq: Once | INTRAVENOUS | Status: AC
  Administered 2014-11-19: 1000 mL via INTRAVENOUS

## 2014-11-19 MED ORDER — PREDNISONE 20 MG PO TABS
20.0000 mg | ORAL_TABLET | Freq: Once | ORAL | Status: AC
  Administered 2014-11-19: 20 mg via ORAL
  Filled 2014-11-19: qty 1

## 2014-11-19 MED ORDER — ONDANSETRON 4 MG PO TBDP: 4.0000 mg | ORAL_TABLET | Freq: Four times a day (QID) | ORAL | Status: DC | PRN

## 2014-11-19 MED ORDER — PREDNISONE 20 MG PO TABS
ORAL_TABLET | ORAL | Status: DC
Start: 2014-11-19 — End: 2014-12-02

## 2014-11-19 NOTE — ED Provider Notes (Signed)
CSN: [id number]     Arrival date & time 11/19/14  1219 History   First MD Initiated Contact with Patient 11/19/14 1312     No chief complaint on file.    (Consider location/radiation/quality/duration/timing/severity/associated sxs/prior Treatment) Patient is a 19 y.o. male presenting with general illness. The history is provided by the patient.  Illness Location:  Diffuse Quality:  General malaise, inability to eat Severity:  Moderate Onset quality:  Gradual Duration:  1 week Timing:  Constant Progression:  Improving Chronicity:  New Context:  Just discharged with diagnosis of ulcerative colitis. Discharged on mesalamine and budesonide Relieved by:  Stopping the new meds Worsened by:  Taking new meds Associated symptoms: fatigue and vomiting   Associated symptoms: no abdominal pain, no chest pain, no cough, no fever and no shortness of breath     Past Medical History  Diagnosis Date  . Ulcerative colitis    Past Surgical History  Procedure Laterality Date  . Flexible sigmoidoscopy N/A 11/08/2014    Procedure: FLEXIBLE SIGMOIDOSCOPY;  Surgeon: Jeryl Columbia, MD;  Location: [facility];  Service: Endoscopy;  Laterality: N/A;   History reviewed. No pertinent family history. History  Substance Use Topics  . Smoking status: Never Smoker   . Smokeless tobacco: Never Used  . Alcohol Use: No    Review of Systems  Constitutional: Positive for appetite change (decreased) and fatigue. Negative for fever.  Respiratory: Negative for cough and shortness of breath.   Cardiovascular: Negative for chest pain and leg swelling.  Gastrointestinal: Positive for vomiting. Negative for abdominal pain.  All other systems reviewed and are negative.     Allergies  Review of patient's allergies indicates no known allergies.  Home Medications   Prior to Admission medications   Medication Sig Start Date End Date Taking? Authorizing Provider  Budesonide 9 MG TB24 Take 1 tablet by mouth  daily. Samples from dr   Yes Historical Provider, MD  mesalamine (LIALDA) 1.2 G EC tablet Take 1.2 g by mouth daily. Samples from dr   Yes Historical Provider, MD  oxyCODONE-acetaminophen (PERCOCET) 5-325 MG per tablet Take 1 tablet by mouth every 6 (six) hours as needed for moderate pain. 11/10/14  Yes Robbie Lis, MD  budesonide (ENTOCORT EC) 3 MG 24 hr capsule Take 3 capsules (9 mg total) by mouth daily. Patient not taking: Reported on 11/19/2014 11/10/14   Robbie Lis, MD  feeding supplement, RESOURCE BREEZE, (RESOURCE BREEZE) LIQD Take 1 Container by mouth 3 (three) times daily between meals. Patient not taking: Reported on 11/19/2014 11/10/14   Robbie Lis, MD   BP 111/85 mmHg  Pulse 83  Temp(Src) 97.9 F (36.6 C) (Oral)  Resp 18  SpO2 98% Physical Exam  Constitutional: He is oriented to person, place, and time. He appears well-developed and well-nourished. No distress.  HENT:  Head: Normocephalic and atraumatic.  Mouth/Throat: No oropharyngeal exudate.  Eyes: EOM are normal. Pupils are equal, round, and reactive to light.  Neck: Normal range of motion. Neck supple.  Cardiovascular: Normal rate and regular rhythm.  Exam reveals no friction rub.   No murmur heard. Pulmonary/Chest: Effort normal and breath sounds normal. No respiratory distress. He has no wheezes. He has no rales.  Abdominal: He exhibits no distension. There is no tenderness. There is no rebound.  Musculoskeletal: Normal range of motion. He exhibits no edema.  Neurological: He is alert and oriented to person, place, and time.  Skin: He is not diaphoretic.  Nursing note and  vitals reviewed.   ED Course  Procedures (including critical care time) Labs Review Labs Reviewed  CBC - Abnormal; Notable for the following:    RBC 4.15 (*)    Hemoglobin 11.6 (*)    HCT 33.7 (*)    All other components within normal limits  COMPREHENSIVE METABOLIC PANEL - Abnormal; Notable for the following:    Sodium 128 (*)     Chloride 91 (*)    Glucose, Bld 117 (*)    Calcium 8.0 (*)    Albumin 2.2 (*)    All other components within normal limits    Imaging Review No results found.   EKG Interpretation None      MDM   Final diagnoses:  Ulcerative colitis, without complications    19 year old male with history of recent diagnosis of ulcerative colitis presenting with general malaise, fatigue, inability to eat. He was discharged one week ago with prescriptions for mesalamine and budesonide. He took the mesalamine for 3 days and then stopped because it made him feel terrible and unable to eat. Also has vomiting. Patient has been eating since but is still feeling fairly bad. He is also feeling better when he takes a budesonide. He has stopped all his medicines and is begun to feel better. He is tolerating by mouth here. He denies any further weight loss. Vitals here are stable. Belly is benign. We'll plan on labs, fluids. I'll speak with GI. Dr. Watt Climes said patient needs to start back on his current meds or begin taking 20 mg of prednisone daily. He can see him later in the week. Labs with mild hyponatremia, has gotten IVF, is tolerating PO here. Stable for discharge.   Evelina Bucy, MD 11/19/14 [phone]

## 2014-11-19 NOTE — Discharge Instructions (Signed)
Ulcerative Colitis Ulcerative colitis is a long lasting swelling and soreness (inflammation) of the colon (large intestine). In patients with ulcerative colitis, sores (ulcers) and inflammation of the inner lining of the colon lead to illness. Ulcerative colitis can also cause problems outside the digestive tract.  Ulcerative colitis is closely related to another condition of inflammation of the intestines called Crohn's disease. Together, they are frequently referred to as inflammatory bowel disease (IBD). Ulcerative colitis and Crohn's diseases are conditions that can last years to decades. Men and women are affected equally. They most commonly begin during adolescence and early adulthood. SYMPTOMS  Common symptoms of ulcerative colitis include rectal bleeding and diarrhea. There is a wide range of symptoms among patients with this disease depending on how severe the disease is. Some of these symptoms are:  Abdominal pain or cramping.  Diarrhea.  Fever.  Tiredness (fatigue).  Weight loss.  Night sweats.  Rectal pain.  Feeling the immediate need to have a bowel movement (rectal urgency). CAUSES  Ulcerative colitis is caused by increased activity of the immune system in the intestines. The immune system is the system that protects the body against disease such as harmful bacteria, viruses, fungi, and other foreign invaders. When the immune system overacts, it causes inflammation. The cause of the increased immune system activity is not known. This over activity causes long-lasting inflammation and ulceration. This condition may be passed down from your parents (inherited). Brothers, sisters, children, and parents of patients with IBD are more likely to develop these diseases. It is not contagious. This means you cannot catch it from someone else. DIAGNOSIS  Your caregiver may suspect ulcerative colitis based on your symptoms and exam. Blood tests may confirm that there is a problem. You may  be asked to submit a stool specimen for examination. X-rays and CT scans may be necessary. Ultimately, the diagnosis is usually made after a flexible tube is inserted via your anus and your colon is examined under sedation (colonoscopy). With this test, the specialist can take a tiny tissue sample from inside the bowel (biopsy). Examination of this biopsy tissue under a microscopy can reveal ulcerative colitis as the cause of your symptoms. TREATMENT   There is no cure for ulcerative colitis.  Complications such as massive bleeding from the colon (hemorrhage), development of a hole in the colon (perforation), or the development of precancerous or cancerous changes of the colon may require surgery.  Medications are often used to decrease inflammation and control the immune system. These include medicines related to aspirin, steroid medications, and newer and stronger medications to slow down the immune system. Some medications may be used as suppositories or enemas. A number of other medications are used or have been studied. Your caregiver will make specific recommendations. HOME CARE INSTRUCTIONS   There is no cure for ulcerative colitis disease. The best treatment is frequent checkups with your caregiver. Periodic reevaluation is important.  Symptoms such as diarrhea can be controlled with medications. Avoid foods that have a laxative effect such fresh fruit and vegetables and dairy products. During flare ups, you can rest your bowel by staying away from solid foods. Drink clear liquids frequently during the day. Electrolyte or rehydrating fluids are best. Your caregiver can help you with suggestions. Drink often to prevent dehydration. When diarrhea has cleared, eat smaller meals and more often. Avoid food additives and stimulants such as caffeine (coffee, tea, many sodas, or chocolate). Avoid dairy products. Enzyme supplements may help if you develop intolerance to  a sugar in dairy products  (lactose). Ask your caregiver or dietitian about specific dietary instructions.  If you had surgery, be sure you understand your care instructions thoroughly, including proper care of any surgical wounds.  Take any medications exactly as prescribed.  Try to maintain a positive attitude. Learn relaxation techniques such as self hypnosis, mental imaging, and muscle relaxation. If possible, avoid stresses that aggravate your condition. Exercise regularly. Follow your diet. Always get plenty of rest. SEEK MEDICAL CARE IF:   Your symptoms fail to improve after a week or two of new treatment.  You experience continued weight loss.  You have ongoing crampy digestion or loose bowels.  You develop a new skin rash, skin sores, or eye problems. SEEK IMMEDIATE MEDICAL CARE IF:   You have worsening of your symptoms or develop new symptoms.  You have an oral temperature above 102 F (38.9 C), not controlled by medicine.  You develop bloody diarrhea.  You have severe abdominal pain. Document Released: 06/04/2005 Document Revised: 11/17/2011 Document Reviewed: 05/04/2007 [facility] Patient Information 2015 [place], [place]. This information is not intended to replace advice given to you by your health care provider. Make sure you discuss any questions you have with your health care provider.

## 2014-11-19 NOTE — ED Notes (Signed)
Patient states he has a history of ulcerative colitis. Patient states when he was discharged from the hospital on 11/10/14 he was prescribed medications for colitis. Patient states when he started to take the medication prescribed he because nauseated, bedridden, and fatigued. Patient states he quit taking the medicaiton x 2 days. Patient states he feels some better, but still fatigued. Patient stated, "The medicine makes me deathly ill."

## 2014-11-23 ENCOUNTER — Encounter (HOSPITAL_COMMUNITY): Payer: Self-pay | Admitting: Emergency Medicine

## 2014-11-23 ENCOUNTER — Emergency Department (HOSPITAL_COMMUNITY)
Admission: EM | Admit: 2014-11-23 | Discharge: 2014-11-24 | Disposition: A | Discharge status: Home / Self Care | Payer: Medicaid Other | Attending: Emergency Medicine | Admitting: Emergency Medicine

## 2014-11-23 DIAGNOSIS — K529 Noninfective gastroenteritis and colitis, unspecified: Secondary | ICD-10-CM | POA: Diagnosis not present

## 2014-11-23 DIAGNOSIS — Z79899 Other long term (current) drug therapy: Secondary | ICD-10-CM | POA: Insufficient documentation

## 2014-11-23 DIAGNOSIS — R109 Unspecified abdominal pain: Secondary | ICD-10-CM | POA: Diagnosis present

## 2014-11-23 LAB — URINALYSIS, ROUTINE W REFLEX MICROSCOPIC
Bilirubin Urine: NEGATIVE
Glucose, UA: NEGATIVE mg/dL
KETONES UR: 15 mg/dL — AB
LEUKOCYTES UA: NEGATIVE
Nitrite: NEGATIVE
PROTEIN: 30 mg/dL — AB
Specific Gravity, Urine: 1.022 (ref 1.005–1.030)
Urobilinogen, UA: 0.2 mg/dL (ref 0.0–1.0)
pH: 6.5 (ref 5.0–8.0)

## 2014-11-23 LAB — CBC WITH DIFFERENTIAL/PLATELET
BASOS ABS: 0 10*3/uL (ref 0.0–0.1)
BASOS PCT: 0 % (ref 0–1)
Eosinophils Absolute: 0 10*3/uL (ref 0.0–0.7)
Eosinophils Relative: 0 % (ref 0–5)
HCT: 32.1 % — ABNORMAL LOW (ref 39.0–52.0)
Hemoglobin: 10.6 g/dL — ABNORMAL LOW (ref 13.0–17.0)
LYMPHS ABS: 2.9 10*3/uL (ref 0.7–4.0)
Lymphocytes Relative: 18 % (ref 12–46)
MCH: 26.8 pg (ref 26.0–34.0)
MCHC: 33 g/dL (ref 30.0–36.0)
MCV: 81.3 fL (ref 78.0–100.0)
MONO ABS: 2.1 10*3/uL — AB (ref 0.1–1.0)
Monocytes Relative: 13 % — ABNORMAL HIGH (ref 3–12)
Neutro Abs: 11 10*3/uL — ABNORMAL HIGH (ref 1.7–7.7)
Neutrophils Relative %: 69 % (ref 43–77)
Platelets: 554 10*3/uL — ABNORMAL HIGH (ref 150–400)
RBC: 3.95 MIL/uL — ABNORMAL LOW (ref 4.22–5.81)
RDW: 12.9 % (ref 11.5–15.5)
WBC Morphology: INCREASED
WBC: 16 10*3/uL — AB (ref 4.0–10.5)

## 2014-11-23 LAB — BASIC METABOLIC PANEL
Anion gap: 11 (ref 5–15)
BUN: 8 mg/dL (ref 6–23)
CO2: 26 mmol/L (ref 19–32)
Calcium: 8.4 mg/dL (ref 8.4–10.5)
Chloride: 91 mmol/L — ABNORMAL LOW (ref 96–112)
Creatinine, Ser: 0.68 mg/dL (ref 0.50–1.35)
GFR calc non Af Amer: >90 mL/min (ref >90)
Glucose, Bld: 102 mg/dL — ABNORMAL HIGH (ref 70–99)
POTASSIUM: 4.2 mmol/L (ref 3.5–5.1)
Sodium: 128 mmol/L — ABNORMAL LOW (ref 135–145)

## 2014-11-23 LAB — LIPASE, BLOOD: Lipase: 18 U/L (ref 11–59)

## 2014-11-23 LAB — URINE MICROSCOPIC-ADD ON

## 2014-11-23 MED ORDER — ONDANSETRON HCL 4 MG/2ML IJ SOLN
4.0000 mg | Freq: Once | INTRAMUSCULAR | Status: AC
  Administered 2014-11-23: 4 mg via INTRAVENOUS
  Filled 2014-11-23: qty 2

## 2014-11-23 MED ORDER — SODIUM CHLORIDE 0.9 % IV BOLUS (SEPSIS)
1000.0000 mL | Freq: Once | INTRAVENOUS | Status: AC
  Administered 2014-11-23: 1000 mL via INTRAVENOUS

## 2014-11-23 MED ORDER — MORPHINE SULFATE 4 MG/ML IJ SOLN
6.0000 mg | Freq: Once | INTRAMUSCULAR | Status: AC
  Administered 2014-11-23: 6 mg via INTRAVENOUS
  Filled 2014-11-23: qty 2

## 2014-11-23 NOTE — ED Notes (Signed)
Pt arrived to the ED with a complaint of abdominal pain.  Pt was diagnosed with ulcerative cho litis two weeks ago.  Pt has prescribed medication but has run out of his pain medication and the pain has become unbearable.  Pain is located on the lower anterior abdomen.

## 2014-11-23 NOTE — ED Notes (Signed)
Patient transported to CT 

## 2014-11-24 MED ORDER — OXYCODONE-ACETAMINOPHEN 5-325 MG PO TABS: 1.0000 | ORAL_TABLET | Freq: Four times a day (QID) | ORAL | Status: DC | PRN

## 2014-11-24 MED ORDER — PROMETHAZINE HCL 25 MG PO TABS: 25.0000 mg | ORAL_TABLET | Freq: Three times a day (TID) | ORAL | Status: DC | PRN

## 2014-11-24 NOTE — ED Notes (Signed)
All discharge papers reviewed and prescriptions reviewed with patient. He was advised to follow up with Dr. Watt Climes tomorrow. Patient reporting that he wants to stay until his brother can get him tomorrow. Pt was informed that he has been discharged and that he should call his brother for a ride. Patient reports that his brother is unable to call him a taxi unit tomorrow and that he wants to stay in his bed until then.

## 2014-11-24 NOTE — ED Notes (Signed)
Pt wheeled to restroom per his request.

## 2014-11-24 NOTE — ED Notes (Signed)
Pt was wheeled to the lobby in a wheel chair per his request as he stated " I just don't want to walk". Blanket given per his request as he waits. He sits in lobby with his cell phone, charger, and DC papers. He was advised once again to call his brother for a ride.

## 2014-11-24 NOTE — ED Provider Notes (Signed)
CSN: [id number]     Arrival date & time 11/23/14  1941 History   First MD Initiated Contact with Patient 11/23/14 2252     Chief Complaint  Patient presents with  . Abdominal Pain     (Consider location/radiation/quality/duration/timing/severity/associated sxs/prior Treatment) HPI Patient presents to the emergency department with continued abdominal pain.  The patient states that he has continued abdominal abdominal pain.  He ran out of his pain medication and antinausea medicine.  Patient states that he does not have any chest pain, shortness breath, weakness, dizziness, headache, blurred vision, back pain, neck pain, dysuria, incontinence, nausea, vomiting, fever or syncope.  Patient states that he has an appointment with the GI doctor Past Medical History  Diagnosis Date  . Ulcerative colitis    Past Surgical History  Procedure Laterality Date  . Flexible sigmoidoscopy N/A 11/08/2014    Procedure: FLEXIBLE SIGMOIDOSCOPY;  Surgeon: Jeryl Columbia, MD;  Location: [facility];  Service: Endoscopy;  Laterality: N/A;   History reviewed. No pertinent family history. History  Substance Use Topics  . Smoking status: Never Smoker   . Smokeless tobacco: Never Used  . Alcohol Use: No    Review of Systems  All other systems negative except as documented in the HPI. All pertinent positives and negatives as reviewed in the HPI.  Allergies  Review of patient's allergies indicates no known allergies.  Home Medications   Prior to Admission medications   Medication Sig Start Date End Date Taking? Authorizing Provider  ondansetron (ZOFRAN ODT) 4 MG disintegrating tablet Take 1 tablet (4 mg total) by mouth every 6 (six) hours as needed for nausea or vomiting. 11/19/14  Yes Evelina Bucy, MD  oxyCODONE-acetaminophen (PERCOCET) 5-325 MG per tablet Take 1 tablet by mouth every 6 (six) hours as needed for moderate pain. 11/10/14  Yes Robbie Lis, MD  predniSONE (DELTASONE) 20 MG tablet 1 tablet daily  for 14 days 11/19/14  Yes Evelina Bucy, MD  budesonide (ENTOCORT EC) 3 MG 24 hr capsule Take 3 capsules (9 mg total) by mouth daily. Patient not taking: Reported on 11/19/2014 11/10/14   Robbie Lis, MD  feeding supplement, RESOURCE BREEZE, (RESOURCE BREEZE) LIQD Take 1 Container by mouth 3 (three) times daily between meals. Patient not taking: Reported on 11/19/2014 11/10/14   Robbie Lis, MD   BP 124/71 mmHg  Pulse 94  Temp(Src) 97.8 F (36.6 C) (Oral)  Resp 14  SpO2 99% Physical Exam  Constitutional: He is oriented to person, place, and time. He appears well-developed and well-nourished. No distress.  HENT:  Head: Normocephalic and atraumatic.  Mouth/Throat: Oropharynx is clear and moist.  Eyes: Pupils are equal, round, and reactive to light.  Neck: Normal range of motion. Neck supple.  Cardiovascular: Normal rate, regular rhythm and normal heart sounds.  Exam reveals no gallop and no friction rub.   No murmur heard. Pulmonary/Chest: Effort normal and breath sounds normal. No respiratory distress.  Abdominal: Soft. Bowel sounds are normal. He exhibits no distension. There is tenderness. There is no rebound and no guarding.  Musculoskeletal: Normal range of motion.  Neurological: He is alert and oriented to person, place, and time. He exhibits normal muscle tone. Coordination normal.  Skin: Skin is warm and dry. No rash noted. No erythema.  Psychiatric: He has a normal mood and affect. His behavior is normal.  Nursing note and vitals reviewed.   ED Course  Procedures (including critical care time) Labs Review Labs Reviewed  BASIC METABOLIC PANEL -  Abnormal; Notable for the following:    Sodium 128 (*)    Chloride 91 (*)    Glucose, Bld 102 (*)    All other components within normal limits  CBC WITH DIFFERENTIAL/PLATELET - Abnormal; Notable for the following:    WBC 16.0 (*)    RBC 3.95 (*)    Hemoglobin 10.6 (*)    HCT 32.1 (*)    Platelets 554 (*)    Monocytes Relative 13  (*)    Neutro Abs 11.0 (*)    Monocytes Absolute 2.1 (*)    All other components within normal limits  URINALYSIS, ROUTINE W REFLEX MICROSCOPIC - Abnormal; Notable for the following:    Color, Urine AMBER (*)    APPearance CLOUDY (*)    Hgb urine dipstick TRACE (*)    Ketones, ur 15 (*)    Protein, ur 30 (*)    All other components within normal limits  LIPASE, BLOOD  URINE MICROSCOPIC-ADD ON    The patient has an appointment with GI.  He states that it is in 2 weeks, but was told by the physician that saw him 2 days ago that he was to follow-up on Thursday or Friday of this week.  Patient will be sent home with pain control told to follow-up by calling tomorrow morning for an appointment for tomorrow afternoon    Dalia Heading, PA-C 11/24/14 [place], MD 11/24/14 [phone]

## 2014-11-24 NOTE — Discharge Instructions (Signed)
Return here as needed.  Call tomorrow morning.  Dr. Marlis Edelson office and tell him that she reirrigated and the emergency department and to ask if they can get urine as soon as possible, such as tomorrow afternoon or Monday

## 2014-11-26 ENCOUNTER — Inpatient Hospital Stay (HOSPITAL_COMMUNITY)
Admission: EM | Admit: 2014-11-26 | Discharge: 2014-12-02 | DRG: 385 | Disposition: A | Discharge status: Home / Self Care | Payer: Medicaid Other | Attending: Internal Medicine | Admitting: Internal Medicine

## 2014-11-26 ENCOUNTER — Encounter (HOSPITAL_COMMUNITY): Payer: Self-pay

## 2014-11-26 DIAGNOSIS — E878 Other disorders of electrolyte and fluid balance, not elsewhere classified: Secondary | ICD-10-CM | POA: Diagnosis present

## 2014-11-26 DIAGNOSIS — E871 Hypo-osmolality and hyponatremia: Secondary | ICD-10-CM | POA: Diagnosis present

## 2014-11-26 DIAGNOSIS — K51011 Ulcerative (chronic) pancolitis with rectal bleeding: Principal | ICD-10-CM | POA: Diagnosis present

## 2014-11-26 DIAGNOSIS — Z7952 Long term (current) use of systemic steroids: Secondary | ICD-10-CM | POA: Diagnosis not present

## 2014-11-26 DIAGNOSIS — D62 Acute posthemorrhagic anemia: Secondary | ICD-10-CM | POA: Diagnosis present

## 2014-11-26 DIAGNOSIS — K625 Hemorrhage of anus and rectum: Secondary | ICD-10-CM | POA: Diagnosis present

## 2014-11-26 DIAGNOSIS — F419 Anxiety disorder, unspecified: Secondary | ICD-10-CM | POA: Diagnosis present

## 2014-11-26 DIAGNOSIS — E860 Dehydration: Secondary | ICD-10-CM | POA: Diagnosis present

## 2014-11-26 DIAGNOSIS — E876 Hypokalemia: Secondary | ICD-10-CM | POA: Diagnosis present

## 2014-11-26 DIAGNOSIS — Z7689 Persons encountering health services in other specified circumstances: Secondary | ICD-10-CM

## 2014-11-26 DIAGNOSIS — E43 Unspecified severe protein-calorie malnutrition: Secondary | ICD-10-CM | POA: Diagnosis present

## 2014-11-26 DIAGNOSIS — R109 Unspecified abdominal pain: Secondary | ICD-10-CM

## 2014-11-26 DIAGNOSIS — D649 Anemia, unspecified: Secondary | ICD-10-CM | POA: Diagnosis present

## 2014-11-26 DIAGNOSIS — K529 Noninfective gastroenteritis and colitis, unspecified: Secondary | ICD-10-CM | POA: Diagnosis present

## 2014-11-26 LAB — CBC WITH DIFFERENTIAL/PLATELET
Basophils Absolute: 0 10*3/uL (ref 0.0–0.1)
Basophils Relative: 0 % (ref 0–1)
EOS ABS: 0 10*3/uL (ref 0.0–0.7)
EOS PCT: 0 % (ref 0–5)
HEMATOCRIT: 22 % — AB (ref 39.0–52.0)
Hemoglobin: 7.6 g/dL — ABNORMAL LOW (ref 13.0–17.0)
LYMPHS ABS: 1.4 10*3/uL (ref 0.7–4.0)
LYMPHS PCT: 7 % — AB (ref 12–46)
MCH: 27.2 pg (ref 26.0–34.0)
MCHC: 34.5 g/dL (ref 30.0–36.0)
MCV: 78.9 fL (ref 78.0–100.0)
Monocytes Absolute: 1.8 10*3/uL — ABNORMAL HIGH (ref 0.1–1.0)
Monocytes Relative: 9 % (ref 3–12)
NEUTROS ABS: 16.6 10*3/uL — AB (ref 1.7–7.7)
Neutrophils Relative %: 84 % — ABNORMAL HIGH (ref 43–77)
Platelets: 522 10*3/uL — ABNORMAL HIGH (ref 150–400)
RBC: 2.79 MIL/uL — ABNORMAL LOW (ref 4.22–5.81)
RDW: 12.8 % (ref 11.5–15.5)
WBC: 19.8 10*3/uL — ABNORMAL HIGH (ref 4.0–10.5)

## 2014-11-26 LAB — URINALYSIS, ROUTINE W REFLEX MICROSCOPIC
Bilirubin Urine: NEGATIVE
GLUCOSE, UA: NEGATIVE mg/dL
HGB URINE DIPSTICK: NEGATIVE
Ketones, ur: 15 mg/dL — AB
Leukocytes, UA: NEGATIVE
Nitrite: NEGATIVE
PH: 6 (ref 5.0–8.0)
PROTEIN: NEGATIVE mg/dL
Specific Gravity, Urine: 1.015 (ref 1.005–1.030)
UROBILINOGEN UA: 0.2 mg/dL (ref 0.0–1.0)

## 2014-11-26 LAB — CBC
HEMATOCRIT: 26.9 % — AB (ref 39.0–52.0)
Hemoglobin: 9.4 g/dL — ABNORMAL LOW (ref 13.0–17.0)
MCH: 28.1 pg (ref 26.0–34.0)
MCHC: 34.9 g/dL (ref 30.0–36.0)
MCV: 80.3 fL (ref 78.0–100.0)
Platelets: 341 10*3/uL (ref 150–400)
RBC: 3.35 MIL/uL — ABNORMAL LOW (ref 4.22–5.81)
RDW: 13.3 % (ref 11.5–15.5)
WBC: 12.7 10*3/uL — ABNORMAL HIGH (ref 4.0–10.5)

## 2014-11-26 LAB — COMPREHENSIVE METABOLIC PANEL
ALBUMIN: 1.9 g/dL — AB (ref 3.5–5.2)
ALT: 32 U/L (ref 0–53)
AST: 15 U/L (ref 0–37)
Alkaline Phosphatase: 52 U/L (ref 39–117)
Anion gap: 11 (ref 5–15)
BUN: 13 mg/dL (ref 6–23)
CALCIUM: 7.7 mg/dL — AB (ref 8.4–10.5)
CHLORIDE: 85 mmol/L — AB (ref 96–112)
CO2: 27 mmol/L (ref 19–32)
CREATININE: 0.55 mg/dL (ref 0.50–1.35)
GFR calc Af Amer: >90 mL/min (ref >90)
GLUCOSE: 127 mg/dL — AB (ref 70–99)
POTASSIUM: 4 mmol/L (ref 3.5–5.1)
SODIUM: 123 mmol/L — AB (ref 135–145)
Total Bilirubin: 0.6 mg/dL (ref 0.3–1.2)
Total Protein: 5.6 g/dL — ABNORMAL LOW (ref 6.0–8.3)

## 2014-11-26 LAB — PREPARE RBC (CROSSMATCH)

## 2014-11-26 LAB — MRSA PCR SCREENING: MRSA BY PCR: NEGATIVE

## 2014-11-26 LAB — CLOSTRIDIUM DIFFICILE BY PCR: Toxigenic C. Difficile by PCR: NEGATIVE

## 2014-11-26 LAB — ABO/RH: ABO/RH(D): O POS

## 2014-11-26 MED ORDER — MESALAMINE 1.2 G PO TBEC
4.8000 g | DELAYED_RELEASE_TABLET | Freq: Every day | ORAL | Status: DC
  Administered 2014-11-26 – 2014-12-02 (×7): 4.8 g via ORAL
  Filled 2014-11-26 (×9): qty 4

## 2014-11-26 MED ORDER — POLYETHYLENE GLYCOL 3350 17 G PO PACK: 17.0000 g | PACK | Freq: Every day | ORAL | Status: DC | PRN

## 2014-11-26 MED ORDER — SODIUM CHLORIDE 0.9 % IV SOLN
Freq: Once | INTRAVENOUS | Status: AC
  Administered 2014-11-26: 15:00:00 via INTRAVENOUS

## 2014-11-26 MED ORDER — ONDANSETRON HCL 4 MG PO TABS: 4.0000 mg | ORAL_TABLET | Freq: Four times a day (QID) | ORAL | Status: DC | PRN

## 2014-11-26 MED ORDER — ONDANSETRON HCL 4 MG/2ML IJ SOLN
4.0000 mg | Freq: Four times a day (QID) | INTRAMUSCULAR | Status: DC | PRN
  Administered 2014-11-26 – 2014-12-01 (×10): 4 mg via INTRAVENOUS
  Filled 2014-11-26 (×10): qty 2

## 2014-11-26 MED ORDER — SODIUM CHLORIDE 0.9 % IV SOLN
INTRAVENOUS | Status: DC
  Administered 2014-11-26 (×2): via INTRAVENOUS
  Administered 2014-11-28: 125 mL/h via INTRAVENOUS
  Administered 2014-11-28 – 2014-12-02 (×6): via INTRAVENOUS

## 2014-11-26 MED ORDER — HYDROMORPHONE HCL 1 MG/ML IJ SOLN
0.5000 mg | Freq: Once | INTRAMUSCULAR | Status: AC
  Administered 2014-11-26: 0.5 mg via INTRAVENOUS
  Filled 2014-11-26: qty 1

## 2014-11-26 MED ORDER — DOCUSATE SODIUM 100 MG PO CAPS
100.0000 mg | ORAL_CAPSULE | Freq: Two times a day (BID) | ORAL | Status: DC
  Administered 2014-11-26 – 2014-11-27 (×3): 100 mg via ORAL
  Filled 2014-11-26 (×6): qty 1

## 2014-11-26 MED ORDER — SODIUM CHLORIDE 0.9 % IJ SOLN
3.0000 mL | Freq: Two times a day (BID) | INTRAMUSCULAR | Status: DC
  Administered 2014-11-26 – 2014-12-01 (×9): 3 mL via INTRAVENOUS

## 2014-11-26 MED ORDER — MORPHINE SULFATE 4 MG/ML IJ SOLN
4.0000 mg | INTRAMUSCULAR | Status: DC | PRN
  Administered 2014-11-26 – 2014-11-27 (×4): 4 mg via SUBCUTANEOUS
  Filled 2014-11-26 (×5): qty 1

## 2014-11-26 MED ORDER — METHYLPREDNISOLONE SODIUM SUCC 40 MG IJ SOLR
40.0000 mg | Freq: Two times a day (BID) | INTRAMUSCULAR | Status: DC
  Administered 2014-11-26 – 2014-12-02 (×12): 40 mg via INTRAVENOUS
  Filled 2014-11-26 (×14): qty 1

## 2014-11-26 MED ORDER — PREDNISONE 20 MG PO TABS: 20.0000 mg | ORAL_TABLET | Freq: Every day | ORAL | Status: DC

## 2014-11-26 MED ORDER — ONDANSETRON HCL 4 MG/2ML IJ SOLN
4.0000 mg | Freq: Once | INTRAMUSCULAR | Status: AC
  Administered 2014-11-26: 4 mg via INTRAVENOUS
  Filled 2014-11-26: qty 2

## 2014-11-26 MED ORDER — SODIUM CHLORIDE 0.9 % IV BOLUS (SEPSIS)
1000.0000 mL | Freq: Once | INTRAVENOUS | Status: AC
  Administered 2014-11-26: 1000 mL via INTRAVENOUS

## 2014-11-26 MED ORDER — MESALAMINE 1.2 G PO TBEC
1.2000 g | DELAYED_RELEASE_TABLET | Freq: Every day | ORAL | Status: DC
  Filled 2014-11-26 (×2): qty 1

## 2014-11-26 MED ORDER — ALUM & MAG HYDROXIDE-SIMETH 200-200-20 MG/5ML PO SUSP
30.0000 mL | Freq: Four times a day (QID) | ORAL | Status: DC | PRN
  Administered 2014-11-27 (×3): 30 mL via ORAL
  Filled 2014-11-26 (×3): qty 30

## 2014-11-26 MED ORDER — HYDROCORTISONE 100 MG/60ML RE ENEM
100.0000 mg | ENEMA | Freq: Every day | RECTAL | Status: DC
  Administered 2014-11-27 – 2014-12-01 (×5): 100 mg via RECTAL
  Filled 2014-11-26 (×8): qty 1

## 2014-11-26 NOTE — H&P (Signed)
[facility]  History and Physical Beckem Tomberlin L. Ardeth Perfect, MD Pager [phone] (if 7P to 7A, page night hospitalist on [email or web address] Ludwig ONG:[id number] DOB: 03/07/1996 DOA: 11/26/2014  Referring physician: ED  PCP: No PCP Per Patient   Chief Complaint:  Frequent bloody diarrhea , GI pain , emesis , insomnia   HPI:  Pt was diagnosed w/ UC this month and was seeing [facility] as an outpatient . Since his discharge earlier this month he has suffered from severe abdominal pain that has remained persistent . However, his bloody diarrhea has progressed and his nausea has progressed . He is now intolerant to PO  and his diet has been limited to bananas and yogurt . He cannot sleep b/c awakens w/ bloody diarrhea every few minutes . He has not had a bloody BM since being in ED. He appears lethargic but his vitals remain stable . He is only eating ice chips in ED . Has leukocytosis but on steroids chronically   Chart Review:  Prior admission notes, labs. Current admission ED notes, labs, imaging   Review of Systems:  Negative except as noted above   Past Medical History  Diagnosis Date  . Ulcerative colitis     Past Surgical History  Procedure Laterality Date  . Flexible sigmoidoscopy N/A 11/08/2014    Procedure: FLEXIBLE SIGMOIDOSCOPY;  Surgeon: Jeryl Columbia, MD;  Location: [facility];  Service: Endoscopy;  Laterality: N/A;    Social History:  reports that he has never smoked. He has never used smokeless tobacco. He reports that he does not drink alcohol or use illicit drugs.  No Known Allergies  History reviewed. No pertinent family history.   Prior to Admission medications   Medication Sig Start Date End Date Taking? Authorizing Provider  ondansetron (ZOFRAN ODT) 4 MG disintegrating tablet Take 1 tablet (4 mg total) by mouth every 6 (six) hours as needed for nausea or vomiting. 11/19/14  Yes Evelina Bucy, MD  oxyCODONE-acetaminophen (PERCOCET/ROXICET) 5-325 MG per tablet Take 1  tablet by mouth every 6 (six) hours as needed for severe pain. 11/24/14  Yes Dalia Heading, PA-C  predniSONE (DELTASONE) 20 MG tablet 1 tablet daily for 14 days 11/19/14  Yes Evelina Bucy, MD  promethazine (PHENERGAN) 25 MG tablet Take 1 tablet (25 mg total) by mouth every 8 (eight) hours as needed for nausea or vomiting. 11/24/14  Yes Dalia Heading, PA-C  budesonide (ENTOCORT EC) 3 MG 24 hr capsule Take 3 capsules (9 mg total) by mouth daily. Patient not taking: Reported on 11/19/2014 11/10/14   Robbie Lis, MD  feeding supplement, RESOURCE BREEZE, (RESOURCE BREEZE) LIQD Take 1 Container by mouth 3 (three) times daily between meals. Patient not taking: Reported on 11/19/2014 11/10/14   Robbie Lis, MD   Physical Exam: Filed Vitals:   11/26/14 0947  BP: 132/76  Pulse: 92  Temp: 97.5 F (36.4 C)  TempSrc: Oral  Resp: 18  SpO2: 100%     General:  WM , lethargic , pale   HEENT: dry MM, anicteric   Cardiovascular: sinus tachy, no mrg   Respiratory: ctab, no wr   Abdomen: extremely tender diffusely to even light palpation   Skin: dry, warm   Musculoskeletal: no focal deficits   Psychiatric: very fatigued but no signs of depression or anxiety   Neurologic: no focal deficits   Wt Readings from Last 3 Encounters:  11/09/14 131 lb 14.4 oz (59.829 kg) (14 %*, Z = -1.07)   * Growth percentiles  are based on CDC 2-20 Years data.    Labs on Admission:  Basic Metabolic Panel:  Recent Labs Lab 11/19/14 1359 11/23/14 2023 11/26/14 1052  NA 128* 128* 123*  K 3.8 4.2 4.0  CL 91* 91* 85*  CO2 31 26 27   GLUCOSE 117* 102* 127*  BUN 7 8 13   CREATININE 0.78 0.68 0.55  CALCIUM 8.0* 8.4 7.7*    Liver Function Tests:  Recent Labs Lab 11/19/14 1359 11/26/14 1052  AST 14 15  ALT 13 32  ALKPHOS 47 52  BILITOT 0.6 0.6  PROT 6.6 5.6*  ALBUMIN 2.2* 1.9*    Recent Labs Lab 11/23/14 2023  LIPASE 18   No results for input(s): AMMONIA in the last 168  hours.  CBC:  Recent Labs Lab 11/19/14 1359 11/23/14 2023 11/26/14 1052  WBC 10.0 16.0* 19.8*  NEUTROABS  --  11.0* 16.6*  HGB 11.6* 10.6* 7.6*  HCT 33.7* 32.1* 22.0*  MCV 81.2 81.3 78.9  PLT 345 554* 522*    Cardiac Enzymes: No results for input(s): CKTOTAL, CKMB, CKMBINDEX, TROPONINI in the last 168 hours.  Troponin (Point of Care Test) No results for input(s): TROPIPOC in the last 72 hours.  BNP (last 3 results) No results for input(s): PROBNP in the last 8760 hours.  CBG: No results for input(s): GLUCAP in the last 168 hours.   Radiological Exams on Admission: No results found.     Active Problems:   Symptomatic anemia   Assessment/Plan 1. UC - admit to SDU. plan per GI. Will resume home prednisone 8m daily and mesalamine 1.2gm daily while we await GI recs , Dr BCristina Gongwill see patient today. Seems patient was intended to start entocort after last d/c but cost likely prohibited this. Biopsy confirmed UC on 3/2  2. Lower GI bleed - 2/2 UC . 2U PRBC ordered by ED. Will check CBC later tonight after transfusion . Admit to SDU given active bleed  3. Hypochloremic hyponatremia - intolerant to PO. Likely 2/2 dehydration . NS at 125cc/hr . NPO currently  4. PPx - SCDs while actively bleeding   Code Status: full Family Communication: at bedside  Disposition Plan/Anticipated LOS: 4+ days   Time spent: 30 minutes  SVelna Hatchet MD  Internal Medicine Pager [phone] 7PM-7AM, please contact night-coverage at [email or web address], password [email or web address]/20/2016, 12:19 PM

## 2014-11-26 NOTE — ED Notes (Signed)
Pt has ulcerative colitis.  Pt has had bloody diarrhea, nausea/vomiting for 2 days.  Unable to keep even fluids down.  Feels weak, tired, and thirsty.  Pale skin on assessment.

## 2014-11-26 NOTE — Consult Note (Signed)
Referring Provider: Dr. Ardeth Perfect (working for [facility]) Primary Care Physician:  No PCP Per Patient Primary Gastroenterologist:  Dr. Clarene Essex  Reason for Consultation:  UC flare up  HPI: Spencer Williams is a 19 y.o. male discharge from the hospital 2 weeks ago for newly diagnosed ulcerative pancolitis, colonoscopically and histologically confirmed. He was sent home only although one tablet daily and prednisone 20 mg daily, and apparently was also on budesonide. Unfortunately, since going home, the patient has been progressively worse, with continuous abdominal pain with periodic sharp exacerbations, frequent and voluminous bloody diarrhea, and intermittent nausea and vomiting which has been continuous for the past couple of days. He finally rescinded again to the emergency room today. He has not been running fevers, nor does he have extraintestinal manifestations of inflammatory bowel disease.   Past Medical History  Diagnosis Date  . Ulcerative colitis     Past Surgical History  Procedure Laterality Date  . Flexible sigmoidoscopy N/A 11/08/2014    Procedure: FLEXIBLE SIGMOIDOSCOPY;  Surgeon: Jeryl Columbia, MD;  Location: [facility];  Service: Endoscopy;  Laterality: N/A;    Prior to Admission medications   Medication Sig Start Date End Date Taking? Authorizing Provider  ondansetron (ZOFRAN ODT) 4 MG disintegrating tablet Take 1 tablet (4 mg total) by mouth every 6 (six) hours as needed for nausea or vomiting. 11/19/14  Yes Evelina Bucy, MD  oxyCODONE-acetaminophen (PERCOCET/ROXICET) 5-325 MG per tablet Take 1 tablet by mouth every 6 (six) hours as needed for severe pain. 11/24/14  Yes Dalia Heading, PA-C  predniSONE (DELTASONE) 20 MG tablet 1 tablet daily for 14 days 11/19/14  Yes Evelina Bucy, MD  promethazine (PHENERGAN) 25 MG tablet Take 1 tablet (25 mg total) by mouth every 8 (eight) hours as needed for nausea or vomiting. 11/24/14  Yes Dalia Heading, PA-C  budesonide  (ENTOCORT EC) 3 MG 24 hr capsule Take 3 capsules (9 mg total) by mouth daily. Patient not taking: Reported on 11/19/2014 11/10/14   Robbie Lis, MD  feeding supplement, RESOURCE BREEZE, (RESOURCE BREEZE) LIQD Take 1 Container by mouth 3 (three) times daily between meals. Patient not taking: Reported on 11/19/2014 11/10/14   Robbie Lis, MD    Current Facility-Administered Medications  Medication Dose Route Frequency Provider Last Rate Last Dose  . 0.9 %  sodium chloride infusion   Intravenous Once Dahlia Bailiff, PA-C      . 0.9 %  sodium chloride infusion   Intravenous Continuous Velna Hatchet, MD 125 mL/hr at 11/26/14 1456    . alum & mag hydroxide-simeth (MAALOX/MYLANTA) 200-200-20 MG/5ML suspension 30 mL  30 mL Oral Q6H PRN Velna Hatchet, MD      . docusate sodium (COLACE) capsule 100 mg  100 mg Oral BID Velna Hatchet, MD   100 mg at 11/26/14 1300  . mesalamine (LIALDA) EC tablet 1.2 g  1.2 g Oral Q breakfast Velna Hatchet, MD   1.2 g at 11/26/14 1443  . morphine 4 MG/ML injection 4 mg  4 mg Subcutaneous Q4H PRN Velna Hatchet, MD   4 mg at 11/26/14 1353  . ondansetron (ZOFRAN) tablet 4 mg  4 mg Oral Q6H PRN Velna Hatchet, MD       Or  . ondansetron (ZOFRAN) injection 4 mg  4 mg Intravenous Q6H PRN Velna Hatchet, MD   4 mg at 11/26/14 1403  . polyethylene glycol (MIRALAX / GLYCOLAX) packet 17 g  17 g Oral Daily PRN Velna Hatchet, MD      . [  START ON 11/27/2014] predniSONE (DELTASONE) tablet 20 mg  20 mg Oral Q breakfast Velna Hatchet, MD      . sodium chloride 0.9 % injection 3 mL  3 mL Intravenous Q12H Velna Hatchet, MD   3 mL at 11/26/14 1338    Allergies as of 11/26/2014  . (No Known Allergies)    History reviewed. No pertinent family history.  History   Social History  . Marital Status: Single    Spouse Name: N/A  . Number of Children: N/A  . Years of Education: N/A   Occupational History  . Not on file.   Social History Main Topics  . Smoking status: Never Smoker    . Smokeless tobacco: Never Used  . Alcohol Use: No  . Drug Use: No  . Sexual Activity: Not Currently   Other Topics Concern  . Not on file   Social History Narrative    Review of Systems: See history of present illness  Physical Exam: Vital signs in last 24 hours: Temp:  [97.5 F (36.4 C)-98.6 F (37 C)] 98.5 F (36.9 C) (03/20 1440) Pulse Rate:  [85-105] 85 (03/20 1440) Resp:  [0-19] 19 (03/20 1440) BP: (122-145)/(62-79) 130/62 mmHg (03/20 1440) SpO2:  [96 %-100 %] 100 % (03/20 1440) Weight:  [50.6 kg (111 lb 8.8 oz)] 50.6 kg (111 lb 8.8 oz) (03/20 1400) Last BM Date: 11/26/14 General:   Alert,  Well-developed, well-nourished, pleasant and cooperative in NAD Head:  Normocephalic and atraumatic. Eyes:  Sclera clear, no icterus.   Conjunctiva pale. Mouth:   No ulcerations or lesions.  Oropharynx pink & moist. Lungs:  Clear throughout to auscultation.   No wheezes, crackles, or rhonchi. No evident respiratory distress. Heart:   Regular rate and rhythm; no murmurs, clicks, rubs,  or gallops. Abdomen:  The abdomen is nondistended and nontympanitic. Bowel sounds are rather quiet. There is diffuse tenderness, mild to moderate in amount, some voluntary guarding, no peritoneal findings. Msk:   Symmetrical without gross deformities. Neurologic:  Alert and coherent;  grossly normal neurologically. Skin:  Intact without significant lesions or rashes. Psych:   Alert and cooperative. Normal mood and affect.  Intake/Output from previous day:   Intake/Output this shift: Total I/O In: 175 [Blood:175] Out: 200 [Urine:200]  Lab Results:  Recent Labs  11/23/14 2023 11/26/14 1052  WBC 16.0* 19.8*  HGB 10.6* 7.6*  HCT 32.1* 22.0*  PLT 554* 522*   BMET  Recent Labs  11/23/14 2023 11/26/14 1052  NA 128* 123*  K 4.2 4.0  CL 91* 85*  CO2 26 27  GLUCOSE 102* 127*  BUN 8 13  CREATININE 0.68 0.55  CALCIUM 8.4 7.7*   LFT  Recent Labs  11/26/14 1052  PROT 5.6*  ALBUMIN  1.9*  AST 15  ALT 32  ALKPHOS 52  BILITOT 0.6   PT/INR No results for input(s): LABPROT, INR in the last 72 hours.  Studies/Results: No results found.  Impression: Severe ulcerative pancolitis, without evidence of toxic megacolon, unresponsive to first and second line agents administered as an outpatient in the doses provided. Although the patient's nausea and vomiting is somewhat atypical, I have seen this before in patients with ulcerative colitis flareups. It is conceivable, but unlikely, that he picked up C. difficile while in the hospital recently, but it does not sound as though he has been having profuse watery diarrhea which would be more characteristic for that infection.  Plan: This patient needs much more aggressive medical therapy. He is  not clinically toxic, so I think we have the opportunity to see how he does on intensive IV steroids. If that is not effective, we may need to consider anti-TNF biologic therapy, or possibly, colectomy or at least referral to a tertiary care inflammatory bowel disease center.   LOS: 0 days   Sallie Staron V  11/26/2014, 3:02 PM

## 2014-11-26 NOTE — ED Provider Notes (Signed)
CSN: [id number]     Arrival date & time 11/26/14  0254 History   First MD Initiated Contact with Patient 11/26/14 [phone]     Chief Complaint  Patient presents with  . Rectal Bleeding  . Emesis     (Consider location/radiation/quality/duration/timing/severity/associated sxs/prior Treatment) HPI Lufty Tapanes is a 19 year old male with past medical history of recently diagnosed ulcerative colitis who presents the ER complaining of bloody diarrhea, nausea, vomiting. Patient states his rectal bleeding has been present for several weeks, as he has been evaluated for colitis and diagnosed with ulcerative colitis earlier this month with admission on 11/07/14. Patient states his first follow-up appointment with gastroenterology as scheduled for tomorrow. Patient states the past 36 hours he has been unable to keep food or fluids down and continues to have bloody diarrhea. Patient reports associated generalized abdominal pain. Patient denies fever, chills, chest pain.   Past Medical History  Diagnosis Date  . Ulcerative colitis    Past Surgical History  Procedure Laterality Date  . Flexible sigmoidoscopy N/A 11/08/2014    Procedure: FLEXIBLE SIGMOIDOSCOPY;  Surgeon: Jeryl Columbia, MD;  Location: [facility];  Service: Endoscopy;  Laterality: N/A;   History reviewed. No pertinent family history. History  Substance Use Topics  . Smoking status: Never Smoker   . Smokeless tobacco: Never Used  . Alcohol Use: No    Review of Systems  Unable to perform ROS: Acuity of condition  Constitutional: Negative for fever.  HENT: Negative for trouble swallowing.   Eyes: Negative for visual disturbance.  Respiratory: Negative for shortness of breath.   Cardiovascular: Negative for chest pain.  Gastrointestinal: Positive for nausea, vomiting, abdominal pain, diarrhea and blood in stool.  Musculoskeletal: Negative for neck pain.  Skin: Positive for pallor.  Psychiatric/Behavioral: Negative.        Allergies  Review of patient's allergies indicates no known allergies.  Home Medications   Prior to Admission medications   Medication Sig Start Date End Date Taking? Authorizing Provider  ondansetron (ZOFRAN ODT) 4 MG disintegrating tablet Take 1 tablet (4 mg total) by mouth every 6 (six) hours as needed for nausea or vomiting. 11/19/14  Yes Evelina Bucy, MD  oxyCODONE-acetaminophen (PERCOCET/ROXICET) 5-325 MG per tablet Take 1 tablet by mouth every 6 (six) hours as needed for severe pain. 11/24/14  Yes Dalia Heading, PA-C  predniSONE (DELTASONE) 20 MG tablet 1 tablet daily for 14 days 11/19/14  Yes Evelina Bucy, MD  promethazine (PHENERGAN) 25 MG tablet Take 1 tablet (25 mg total) by mouth every 8 (eight) hours as needed for nausea or vomiting. 11/24/14  Yes Dalia Heading, PA-C  budesonide (ENTOCORT EC) 3 MG 24 hr capsule Take 3 capsules (9 mg total) by mouth daily. Patient not taking: Reported on 11/19/2014 11/10/14   Robbie Lis, MD  feeding supplement, RESOURCE BREEZE, (RESOURCE BREEZE) LIQD Take 1 Container by mouth 3 (three) times daily between meals. Patient not taking: Reported on 11/19/2014 11/10/14   Robbie Lis, MD   BP 158/71 mmHg  Pulse 95  Temp(Src) 98.6 F (37 C) (Oral)  Resp 10  Ht 5' 7"  (1.702 m)  Wt 111 lb 1.8 oz (50.4 kg)  BMI 17.40 kg/m2  SpO2 100% Physical Exam  Constitutional: He is oriented to person, place, and time. He appears lethargic. He appears cachectic. He appears toxic.  HENT:  Head: Normocephalic and atraumatic.  Mouth/Throat: Oropharynx is clear and moist. No oropharyngeal exudate.  Eyes: Right eye exhibits no discharge. Left eye exhibits no  discharge. No scleral icterus.  Neck: Normal range of motion.  Cardiovascular: Regular rhythm, S1 normal, S2 normal and normal heart sounds.  Tachycardia present.   No murmur heard. Tachycardic at 105 on exam  Pulmonary/Chest: Effort normal and breath sounds normal. No respiratory distress.   Abdominal: Soft. There is no tenderness.  Musculoskeletal: Normal range of motion. He exhibits no edema or tenderness.  Neurological: He is oriented to person, place, and time. He appears lethargic. No sensory deficit. Coordination normal. GCS eye subscore is 4. GCS verbal subscore is 5. GCS motor subscore is 6.  Skin: Skin is warm and dry. No rash noted.  Psychiatric: He has a normal mood and affect.  Nursing note and vitals reviewed.   ED Course  Procedures (including critical care time) Labs Review Labs Reviewed  CBC WITH DIFFERENTIAL/PLATELET - Abnormal; Notable for the following:    WBC 19.8 (*)    RBC 2.79 (*)    Hemoglobin 7.6 (*)    HCT 22.0 (*)    Platelets 522 (*)    Neutrophils Relative % 84 (*)    Lymphocytes Relative 7 (*)    Neutro Abs 16.6 (*)    Monocytes Absolute 1.8 (*)    All other components within normal limits  COMPREHENSIVE METABOLIC PANEL - Abnormal; Notable for the following:    Sodium 123 (*)    Chloride 85 (*)    Glucose, Bld 127 (*)    Calcium 7.7 (*)    Total Protein 5.6 (*)    Albumin 1.9 (*)    All other components within normal limits  URINALYSIS, ROUTINE W REFLEX MICROSCOPIC - Abnormal; Notable for the following:    Ketones, ur 15 (*)    All other components within normal limits  CBC - Abnormal; Notable for the following:    WBC 12.7 (*)    RBC 3.35 (*)    Hemoglobin 9.4 (*)    HCT 26.9 (*)    All other components within normal limits  COMPREHENSIVE METABOLIC PANEL - Abnormal; Notable for the following:    Sodium 130 (*)    Chloride 95 (*)    Glucose, Bld 101 (*)    Calcium 7.5 (*)    Total Protein 5.1 (*)    Albumin 1.8 (*)    All other components within normal limits  CBC - Abnormal; Notable for the following:    WBC 15.3 (*)    RBC 3.12 (*)    Hemoglobin 8.7 (*)    HCT 25.0 (*)    All other components within normal limits  PROTIME-INR - Abnormal; Notable for the following:    Prothrombin Time 15.9 (*)    All other  components within normal limits  C-REACTIVE PROTEIN - Abnormal; Notable for the following:    CRP 14.7 (*)    All other components within normal limits  MRSA PCR SCREENING  CLOSTRIDIUM DIFFICILE BY PCR  GI PATHOGEN PANEL BY PCR, STOOL  TYPE AND SCREEN  PREPARE RBC (CROSSMATCH)  ABO/RH    Imaging Review Dg Abd 2 Views  11/27/2014   CLINICAL DATA:  Abdominal pain for 3 days, history of ulcerative colitis  EXAM: ABDOMEN - 2 VIEW  COMPARISON:  11/07/2014  FINDINGS: Scattered large and small bowel gas is noted. Fecal material is noted within the colon. No obstructive changes are seen. No free air is noted. No bony abnormality is seen.  IMPRESSION: Nonspecific abdomen.   Electronically Signed   By: Inez Catalina M.D.   On:  11/27/2014 11:37     EKG Interpretation None      MDM   Final diagnoses:  Abdominal pain    19 year old male recently diagnosed with ulcerative colitis, here back with continuous rectal bleed, intractable nausea and vomiting for the past 36 hours. On exam here patient is cachectic appearing, with sickly appearance. Generalized tenderness noted to abdomen. Given patient's recent admission and diagnosis with recent scans, doubt surgical abdomen at this point. Notably patient has a drop in his hemoglobin from 10.6-7.6 and the past 3 days. Plan to consult with gastroenterology and admit patient to medicine. We'll begin nonemergent blood transfusion.  I spoke with Dr. Cristina Gong regarding patient's case and he agrees to see patient in consult. Patient admitted to medicine under Dr. Ardeth Perfect for blood transfusion, symptomatic anemia and for further workup. The patient appears reasonably stabilized for admission considering the current resources, flow, and capabilities available in the ED at this time, and I doubt any other [facility] requiring further screening and/or treatment in the ED prior to admission.  Signed,  Dahlia Bailiff, PA-C 10:26 PM  Patient seen and discussed with Dr.  Blanchie Dessert, MD   Dahlia Bailiff, PA-C 11/27/14 [place], MD 11/30/14 [phone]

## 2014-11-27 ENCOUNTER — Inpatient Hospital Stay (HOSPITAL_COMMUNITY): Payer: Medicaid Other

## 2014-11-27 DIAGNOSIS — K51919 Ulcerative colitis, unspecified with unspecified complications: Secondary | ICD-10-CM

## 2014-11-27 LAB — CBC
HCT: 25 % — ABNORMAL LOW (ref 39.0–52.0)
HEMOGLOBIN: 8.7 g/dL — AB (ref 13.0–17.0)
MCH: 27.9 pg (ref 26.0–34.0)
MCHC: 34.8 g/dL (ref 30.0–36.0)
MCV: 80.1 fL (ref 78.0–100.0)
Platelets: 330 10*3/uL (ref 150–400)
RBC: 3.12 MIL/uL — ABNORMAL LOW (ref 4.22–5.81)
RDW: 13.5 % (ref 11.5–15.5)
WBC: 15.3 10*3/uL — ABNORMAL HIGH (ref 4.0–10.5)

## 2014-11-27 LAB — COMPREHENSIVE METABOLIC PANEL
ALT: 24 U/L (ref 0–53)
AST: 14 U/L (ref 0–37)
Albumin: 1.8 g/dL — ABNORMAL LOW (ref 3.5–5.2)
Alkaline Phosphatase: 47 U/L (ref 39–117)
Anion gap: 8 (ref 5–15)
BUN: 9 mg/dL (ref 6–23)
CALCIUM: 7.5 mg/dL — AB (ref 8.4–10.5)
CO2: 27 mmol/L (ref 19–32)
CREATININE: 0.54 mg/dL (ref 0.50–1.35)
Chloride: 95 mmol/L — ABNORMAL LOW (ref 96–112)
GFR calc non Af Amer: >90 mL/min (ref >90)
GLUCOSE: 101 mg/dL — AB (ref 70–99)
Potassium: 3.7 mmol/L (ref 3.5–5.1)
Sodium: 130 mmol/L — ABNORMAL LOW (ref 135–145)
TOTAL PROTEIN: 5.1 g/dL — AB (ref 6.0–8.3)
Total Bilirubin: 0.5 mg/dL (ref 0.3–1.2)

## 2014-11-27 LAB — PROTIME-INR
INR: 1.26 (ref 0.00–1.49)
Prothrombin Time: 15.9 seconds — ABNORMAL HIGH (ref 11.6–15.2)

## 2014-11-27 LAB — C-REACTIVE PROTEIN: CRP: 14.7 mg/dL — ABNORMAL HIGH (ref <0.60)

## 2014-11-27 MED ORDER — BOOST / RESOURCE BREEZE PO LIQD
1.0000 | Freq: Three times a day (TID) | ORAL | Status: DC
  Administered 2014-11-27 – 2014-12-02 (×14): 1 via ORAL

## 2014-11-27 MED ORDER — MORPHINE SULFATE 4 MG/ML IJ SOLN
4.0000 mg | INTRAMUSCULAR | Status: DC | PRN
  Administered 2014-11-27 – 2014-12-01 (×20): 4 mg via INTRAVENOUS
  Filled 2014-11-27 (×19): qty 1

## 2014-11-27 NOTE — Progress Notes (Signed)
INITIAL NUTRITION ASSESSMENT  DOCUMENTATION CODES Per approved criteria  -Severe malnutrition in the context of chronic illness   Pt meets criteria for severe malnutrition in the context of chronic illness as evidenced by 33% wt loss in past month, <75% PO intake in the past month and severe depletion of fat and muscle mass.   INTERVENTION: -Resource Breeze TID each providing 250 kcal and 9 grams of protein -Continue to monitor   NUTRITION DIAGNOSIS: Inadequate oral intake related to decreased appetite as evidenced by abdominal pain, nausea and bloody diarrhea.   Goal: Pt to meet >/= 90% of estimated needs  Monitor:  PO intake, supplement tolerance, weight trends, labs  Reason for Assessment: MST 2  19 y.o. male  Admitting Dx: <principal problem not specified>  ASSESSMENT: Pt diagnosed w/ UC this month, readmit.  Pt has suffered severe abdominal pain since discharge and bloody diarrhea and nausea has progressed.    Pt reports for the past month he as only been able to tolerate low fat yogurt and bananas but some days he would eat nothing.  Pt reports he weighed about 165 lbs a month ago before his diagnosis, he is now 111 lbs per wt records.  This is a 33% wt loss in the past month (significant for time frame).   Pt states Resource Breeze made him nauseous during his last hospital stay but says he will try it since he is now on medications for nausea.    Nutrition Focused Physical Exam:  Subcutaneous Fat:  Orbital Region: severe depletion Upper Arm Region: WDL Thoracic and Lumbar Region: n/a  Muscle:  Temple Region: moderate depletion Clavicle Bone Region: severe depletion Clavicle and Acromion Bone Region: severe depletion Scapular Bone Region: n/a Dorsal Hand: n/a Patellar Region: moderate depletion Anterior Thigh Region: severe depletion Posterior Calf Region: moderate depletion  Edema: none present   Height: Ht Readings from Last 1 Encounters:  11/26/14 5'  7" (1.702 m) (18 %*, Z = -0.92)   * Growth percentiles are based on CDC 2-20 Years data.    Weight: Wt Readings from Last 1 Encounters:  11/27/14 111 lb 1.8 oz (50.4 kg) (1 %*, Z = -2.43)   * Growth percentiles are based on CDC 2-20 Years data.    Ideal Body Weight: 148 (67 kg)  % Ideal Body Weight: 75%  Wt Readings from Last 10 Encounters:  11/27/14 111 lb 1.8 oz (50.4 kg) (1 %*, Z = -2.43)  11/09/14 131 lb 14.4 oz (59.829 kg) (14 %*, Z = -1.07)   * Growth percentiles are based on CDC 2-20 Years data.    Usual Body Weight: 165 lbs   % Usual Body Weight: 67%  BMI:  Body mass index is 17.4 kg/(m^2).  Estimated Nutritional Needs: Kcal: 1700-1900 Protein: 90-100 g Fluid: 1.9 L/day  Skin: WDL  Diet Order: Diet clear liquid  EDUCATION NEEDS: -No education needs identified at this time   Intake/Output Summary (Last 24 hours) at 11/27/14 1500 Last data filed at 11/27/14 1400  Gross per 24 hour  Intake   3760 ml  Output   3150 ml  Net    610 ml    Last BM: 3/21   Labs:   Recent Labs Lab 11/23/14 2023 11/26/14 1052 11/27/14 0402  NA 128* 123* 130*  K 4.2 4.0 3.7  CL 91* 85* 95*  CO2 26 27 27   BUN 8 13 9   CREATININE 0.68 0.55 0.54  CALCIUM 8.4 7.7* 7.5*  GLUCOSE 102* 127*  101*    CBG (last 3)  No results for input(s): GLUCAP in the last 72 hours.  Scheduled Meds: . docusate sodium  100 mg Oral BID  . hydrocortisone  100 mg Rectal QHS  . mesalamine  4.8 g Oral Q breakfast  . methylPREDNISolone (SOLU-MEDROL) injection  40 mg Intravenous Q12H  . sodium chloride  3 mL Intravenous Q12H    Continuous Infusions: . sodium chloride 125 mL/hr at 11/26/14 1456    Past Medical History  Diagnosis Date  . Ulcerative colitis     Past Surgical History  Procedure Laterality Date  . Flexible sigmoidoscopy N/A 11/08/2014    Procedure: FLEXIBLE SIGMOIDOSCOPY;  Surgeon: Jeryl Columbia, MD;  Location: [facility];  Service: Endoscopy;  Laterality: N/A;     Elmer Picker MS Dietetic Intern Pager Number [phone]

## 2014-11-27 NOTE — Progress Notes (Signed)
[facility] PROGRESS NOTE  Spencer Williams DDU:[id number] DOB: [birth year] DOA: 11/26/2014 PCP: No PCP Per Patient  Assessment/Plan: 1. UC flair - GI assisting with management - since it takes time to get GI pathogen panel results back will go ahead and order - Continuing high dose steroid regimen - continue supportive therapy   Code Status:  Family Communication: None at bedside Disposition Plan: Pending improvement in condition. Continue to monitor on at icu/stepdown   Consultants:  Gastroenterology  Procedures:  none  Antibiotics:  None  HPI/Subjective: Pt states that pain is tolerable once he receives pain medication.  Objective: Filed Vitals:   11/27/14 1200  BP: 130/67  Pulse: 84  Temp:   Resp: 16    Intake/Output Summary (Last 24 hours) at 11/27/14 1257 Last data filed at 11/27/14 1200  Gross per 24 hour  Intake   3810 ml  Output   3350 ml  Net    460 ml   Filed Weights   11/26/14 1400 11/27/14 0432  Weight: 50.6 kg (111 lb 8.8 oz) 50.4 kg (111 lb 1.8 oz)    Exam:   General:  Pt in nad, alert and awkae  Cardiovascular: rrr, no mrg  Respiratory: cta bl, no wheezes  Abdomen: soft, ND, no guarding  Musculoskeletal: no cyanosis or clubbing   Data Reviewed: Basic Metabolic Panel:  Recent Labs Lab 11/23/14 2023 11/26/14 1052 11/27/14 0402  NA 128* 123* 130*  K 4.2 4.0 3.7  CL 91* 85* 95*  CO2 26 27 27   GLUCOSE 102* 127* 101*  BUN 8 13 9   CREATININE 0.68 0.55 0.54  CALCIUM 8.4 7.7* 7.5*   Liver Function Tests:  Recent Labs Lab 11/26/14 1052 11/27/14 0402  AST 15 14  ALT 32 24  ALKPHOS 52 47  BILITOT 0.6 0.5  PROT 5.6* 5.1*  ALBUMIN 1.9* 1.8*    Recent Labs Lab 11/23/14 2023  LIPASE 18   No results for input(s): AMMONIA in the last 168 hours. CBC:  Recent Labs Lab 11/23/14 2023 11/26/14 1052 11/26/14 2005 11/27/14 0402  WBC 16.0* 19.8* 12.7* 15.3*  NEUTROABS 11.0* 16.6*  --   --   HGB 10.6* 7.6* 9.4*  8.7*  HCT 32.1* 22.0* 26.9* 25.0*  MCV 81.3 78.9 80.3 80.1  PLT 554* 522* 341 330   Cardiac Enzymes: No results for input(s): CKTOTAL, CKMB, CKMBINDEX, TROPONINI in the last 168 hours. BNP (last 3 results) No results for input(s): BNP in the last 8760 hours.  ProBNP (last 3 results) No results for input(s): PROBNP in the last 8760 hours.  CBG: No results for input(s): GLUCAP in the last 168 hours.  Recent Results (from the past 240 hour(s))  MRSA PCR Screening     Status: None   Collection Time: 11/26/14  1:26 PM  Result Value Ref Range Status   MRSA by PCR NEGATIVE NEGATIVE Final    Comment:        The GeneXpert MRSA Assay (FDA approved for NASAL specimens only), is one component of a comprehensive MRSA colonization surveillance program. It is not intended to diagnose MRSA infection nor to guide or monitor treatment for MRSA infections.   Clostridium Difficile by PCR     Status: None   Collection Time: 11/26/14  6:38 PM  Result Value Ref Range Status   C difficile by pcr NEGATIVE NEGATIVE Final     Studies: Dg Abd 2 Views  11/27/2014   CLINICAL DATA:  Abdominal pain for 3 days, history of ulcerative  colitis  EXAM: ABDOMEN - 2 VIEW  COMPARISON:  11/07/2014  FINDINGS: Scattered large and small bowel gas is noted. Fecal material is noted within the colon. No obstructive changes are seen. No free air is noted. No bony abnormality is seen.  IMPRESSION: Nonspecific abdomen.   Electronically Signed   By: Inez Catalina M.D.   On: 11/27/2014 11:37    Scheduled Meds: . docusate sodium  100 mg Oral BID  . hydrocortisone  100 mg Rectal QHS  . mesalamine  4.8 g Oral Q breakfast  . methylPREDNISolone (SOLU-MEDROL) injection  40 mg Intravenous Q12H  . sodium chloride  3 mL Intravenous Q12H   Continuous Infusions: . sodium chloride 125 mL/hr at 11/26/14 1456    Active Problems:   Symptomatic anemia    Time spent: > 35 minutes    Velvet Bathe  [facility] Pager  [phone]. If 7PM-7AM, please contact night-coverage at [email or web address], password [email or web address] 11/27/2014, 12:57 PM  LOS: 1 day

## 2014-11-27 NOTE — Progress Notes (Signed)
Subjective: Having escalating abdominal pain. 15+ loose bloody stools yesterday.  Objective: Vital signs in last 24 hours: Temp:  [97.7 F (36.5 C)-98.9 F (37.2 C)] 97.7 F (36.5 C) (03/21 0800) Pulse Rate:  [76-103] 76 (03/21 1000) Resp:  [0-20] 14 (03/21 1000) BP: (109-151)/(47-89) 142/76 mmHg (03/21 1000) SpO2:  [96 %-100 %] 99 % (03/21 1000) Weight:  [50.4 kg (111 lb 1.8 oz)-50.6 kg (111 lb 8.8 oz)] 50.4 kg (111 lb 1.8 oz) (03/21 0432) Weight change:  Last BM Date: 11/27/14  PE: GEN:  NAD, pale and deconditioned-appearing, somewhat uncomfortable-appearing HEENT:  Conjunctival pallor ABD:  Moderate distention with tympany; diffuse mild-to-moderate tenderness  Lab Results: CBC    Component Value Date/Time   WBC 15.3* 11/27/2014 0402   RBC 3.12* 11/27/2014 0402   HGB 8.7* 11/27/2014 0402   HCT 25.0* 11/27/2014 0402   PLT 330 11/27/2014 0402   MCV 80.1 11/27/2014 0402   MCH 27.9 11/27/2014 0402   MCHC 34.8 11/27/2014 0402   RDW 13.5 11/27/2014 0402   LYMPHSABS 1.4 11/26/2014 1052   MONOABS 1.8* 11/26/2014 1052   EOSABS 0.0 11/26/2014 1052   BASOSABS 0.0 11/26/2014 1052   CMP     Component Value Date/Time   NA 130* 11/27/2014 0402   K 3.7 11/27/2014 0402   CL 95* 11/27/2014 0402   CO2 27 11/27/2014 0402   GLUCOSE 101* 11/27/2014 0402   BUN 9 11/27/2014 0402   CREATININE 0.54 11/27/2014 0402   CALCIUM 7.5* 11/27/2014 0402   PROT 5.1* 11/27/2014 0402   ALBUMIN 1.8* 11/27/2014 0402   AST 14 11/27/2014 0402   ALT 24 11/27/2014 0402   ALKPHOS 47 11/27/2014 0402   BILITOT 0.5 11/27/2014 0402   GFRNONAA >90 11/27/2014 0402   GFRAA >90 11/27/2014 0402   Assessment:  1.  Ulcerative colitis flare. 2.  Escalating abdominal pain with moderate distention.  Concern for megacolon.  Plan:  1.  Continue intravenous steroids. 2.  Abdominal xray to assess for megacolon. 3.  Stool C. Diff is negative; if no persistent improvement, and xray not worrisome for  megacolon, would consider flexible sigmoidoscopy +/- GI pathogen panel in the next couple days. 4.  Will follow.  [name] 11/27/2014, 11:33 AM

## 2014-11-27 NOTE — Care Management Note (Signed)
  Page 1 of 1   11/27/2014     10:27:54 AM CARE MANAGEMENT NOTE 11/27/2014  Patient:  MAREK, NGHIEM   Account Number:  [id number]  Date Initiated:  11/27/2014  Documentation initiated by:  DAVIS,RHONDA  Subjective/Objective Assessment:   newly diagnosed ulcerative pancolitis, colonoscopically and histologically confirmed     Action/Plan:   home when stable   Anticipated DC Date:  11/30/2014   Anticipated DC Plan:  HOME/SELF CARE  In-house referral  [place]  CM consult      [facility] Choice  NA   Choice offered to / List presented to:  NA           Status of service:  In process, will continue to follow Medicare Important Message given?   (If response is "NO", the following Medicare IM given date fields will be blank) Date Medicare IM given:   Medicare IM given by:   Date Additional Medicare IM given:   Additional Medicare IM given by:    Discharge Disposition:    Per UR Regulation:  Reviewed for med. necessity/level of care/duration of stay  If discussed at [place] of Stay Meetings, dates discussed:    Comments:  November 27, 2014/Rhonda L. Rosana Hoes, RN, BSN, CCM. Case Management [place] [phone] No discharge needs present of time of review.

## 2014-11-28 DIAGNOSIS — E43 Unspecified severe protein-calorie malnutrition: Secondary | ICD-10-CM

## 2014-11-28 DIAGNOSIS — K529 Noninfective gastroenteritis and colitis, unspecified: Secondary | ICD-10-CM

## 2014-11-28 LAB — CBC
HCT: 21 % — ABNORMAL LOW (ref 39.0–52.0)
HCT: 23.4 % — ABNORMAL LOW (ref 39.0–52.0)
Hemoglobin: 7.2 g/dL — ABNORMAL LOW (ref 13.0–17.0)
Hemoglobin: 8.1 g/dL — ABNORMAL LOW (ref 13.0–17.0)
MCH: 27.8 pg (ref 26.0–34.0)
MCH: 27.8 pg (ref 26.0–34.0)
MCHC: 34.3 g/dL (ref 30.0–36.0)
MCHC: 34.6 g/dL (ref 30.0–36.0)
MCV: 80.4 fL (ref 78.0–100.0)
MCV: 81.1 fL (ref 78.0–100.0)
PLATELETS: 400 10*3/uL (ref 150–400)
PLATELETS: 447 10*3/uL — AB (ref 150–400)
RBC: 2.59 MIL/uL — AB (ref 4.22–5.81)
RBC: 2.91 MIL/uL — AB (ref 4.22–5.81)
RDW: 13.5 % (ref 11.5–15.5)
RDW: 13.5 % (ref 11.5–15.5)
WBC: 10.8 10*3/uL — ABNORMAL HIGH (ref 4.0–10.5)
WBC: 15.2 10*3/uL — ABNORMAL HIGH (ref 4.0–10.5)

## 2014-11-28 LAB — BASIC METABOLIC PANEL
Anion gap: 6 (ref 5–15)
BUN: 7 mg/dL (ref 6–23)
CO2: 28 mmol/L (ref 19–32)
CREATININE: 0.44 mg/dL — AB (ref 0.50–1.35)
Calcium: 7.2 mg/dL — ABNORMAL LOW (ref 8.4–10.5)
Chloride: 96 mmol/L (ref 96–112)
GFR calc Af Amer: >90 mL/min (ref >90)
GLUCOSE: 131 mg/dL — AB (ref 70–99)
Potassium: 3.4 mmol/L — ABNORMAL LOW (ref 3.5–5.1)
Sodium: 130 mmol/L — ABNORMAL LOW (ref 135–145)

## 2014-11-28 LAB — PREPARE RBC (CROSSMATCH)

## 2014-11-28 MED ORDER — SIMETHICONE 80 MG PO CHEW
160.0000 mg | CHEWABLE_TABLET | Freq: Once | ORAL | Status: AC
  Administered 2014-11-29: 160 mg via ORAL
  Filled 2014-11-28: qty 2

## 2014-11-28 MED ORDER — SODIUM CHLORIDE 0.9 % IV SOLN
Freq: Once | INTRAVENOUS | Status: AC
  Administered 2014-11-28: 11:00:00 via INTRAVENOUS

## 2014-11-28 NOTE — Progress Notes (Signed)
Subjective: Less diarrhea and blood in stool. Abdominal pain improving.  Objective: Vital signs in last 24 hours: Temp:  [97.7 F (36.5 C)-98.9 F (37.2 C)] 98.2 F (36.8 C) (03/22 1230) Pulse Rate:  [71-108] 73 (03/22 1240) Resp:  [10-20] 13 (03/22 1240) BP: (108-158)/(48-78) 132/71 mmHg (03/22 1230) SpO2:  [97 %-100 %] 100 % (03/22 1240) Weight:  [52.8 kg (116 lb 6.5 oz)] 52.8 kg (116 lb 6.5 oz) (03/22 0359) Weight change: 2.2 kg (4 lb 13.6 oz) Last BM Date: 11/28/14  PE: GEN:  NAD ABD:  Soft, less distended, less tympanic, mild diffuse tenderness without peritonitis  Lab Results: CBC    Component Value Date/Time   WBC 10.8* 11/28/2014 0830   RBC 2.59* 11/28/2014 0830   HGB 7.2* 11/28/2014 0830   HCT 21.0* 11/28/2014 0830   PLT 400 11/28/2014 0830   MCV 81.1 11/28/2014 0830   MCH 27.8 11/28/2014 0830   MCHC 34.3 11/28/2014 0830   RDW 13.5 11/28/2014 0830   LYMPHSABS 1.4 11/26/2014 1052   MONOABS 1.8* 11/26/2014 1052   EOSABS 0.0 11/26/2014 1052   BASOSABS 0.0 11/26/2014 1052   CMP     Component Value Date/Time   NA 130* 11/28/2014 0830   K 3.4* 11/28/2014 0830   CL 96 11/28/2014 0830   CO2 28 11/28/2014 0830   GLUCOSE 131* 11/28/2014 0830   BUN 7 11/28/2014 0830   CREATININE 0.44* 11/28/2014 0830   CALCIUM 7.2* 11/28/2014 0830   PROT 5.1* 11/27/2014 0402   ALBUMIN 1.8* 11/27/2014 0402   AST 14 11/27/2014 0402   ALT 24 11/27/2014 0402   ALKPHOS 47 11/27/2014 0402   BILITOT 0.5 11/27/2014 0402   GFRNONAA >90 11/28/2014 0830   GFRAA >90 11/28/2014 0830   Assessment:  1.  Bloody diarrhea.  Presumed ulcerative colitis flare. 2.  Abdominal pain and distention, no megacolon seen on xray, improving. 3.  Anemia, acute blood loss.  Plan:  1.  Continue intravenous steroids and clear liquids. 2.  Need social worker assistance to see if we can get pharmaceutical assistance for use of anti TNF-alpha agents (adalimumab, golimumab, infliximab), which will likely  be required for long-term medical therapy. 3.  If continues to improve, might try to advance diet slowly tomorrow; would possibly consider transitioning to oral steroids in a couple days as well, if he continues to improve.   [name] 11/28/2014, 12:52 PM

## 2014-11-28 NOTE — Progress Notes (Signed)
[facility]                                                                              Patient Demographics  Spencer Williams, is a 19 y.o. male, DOB - 27-Jan-1996, KCL:[id number]  Admit date - 11/26/2014   Admitting Physician No admitting provider for patient encounter.  Outpatient Primary MD for the patient is No PCP Per Patient  LOS - 2   Chief Complaint  Patient presents with  . Rectal Bleeding  . Emesis       Brief HPI   Patient is a 19 year old male diagnosed with ulcerative colitis this month. Patient presented with frequent bloody diarrhea, severe abdominal pain, nausea, vomiting. Patient was admitted for further workup   Assessment & Plan    Principal Problem:  Ulcerative Colitis flare: Continues to have frequent bloody bowel movements, 11 overnight - Continues to have frequent bloody diarrhea. Check CBC - on mesalamine and steroid regimen -  no significant improvement per patient, GI following   Active Problems:   Protein-calorie malnutrition, severe - Continue nutritional supplements    Symptomatic anemia - Hemoglobin 7.2 Transfuse 2 units packed RBCs   Code Status: Full code  Family Communication: Discussed in detail with the patient, all imaging results, lab results explained to the patient    Disposition Plan: Remains inpatient   Time Spent in minutes   25 minutes  Procedures  Abd Xray  Consults   Gastroenterology  DVT Prophylaxis   SCD's  Medications  Scheduled Meds: . docusate sodium  100 mg Oral BID  . feeding supplement (RESOURCE BREEZE)  1 Container Oral TID BM  . hydrocortisone  100 mg Rectal QHS  . mesalamine  4.8 g Oral Q breakfast  . methylPREDNISolone (SOLU-MEDROL) injection  40 mg Intravenous Q12H  . sodium chloride  3 mL Intravenous Q12H   Continuous Infusions: . sodium chloride 125 mL/hr at 11/26/14 1456   PRN Meds:.alum & mag hydroxide-simeth, morphine injection, ondansetron **OR** ondansetron (ZOFRAN)  IV, polyethylene glycol   Antibiotics   Anti-infectives    None        Subjective:   Spencer Williams was seen and examined today.  Patient denies dizziness, chest pain, shortness of breath. Continues to have abdominal pain,  nausea and bloody diarrhea. No acute events overnight.    Objective:   Blood pressure 121/59, pulse 86, temperature 98.6 F (37 C), temperature source Oral, resp. rate 17, height 5' 7"  (1.702 m), weight 52.8 kg (116 lb 6.5 oz), SpO2 100 %.  Wt Readings from Last 3 Encounters:  11/28/14 52.8 kg (116 lb 6.5 oz) (2 %*, Z = -2.04)  11/09/14 59.829 kg (131 lb 14.4 oz) (14 %*, Z = -1.07)   * Growth percentiles are based on CDC 2-20 Years data.     Intake/Output Summary (Last 24 hours) at 11/28/14 1147 Last data filed at 11/28/14 1100  Gross per 24 hour  Intake   2930 ml  Output   1545 ml  Net   1385 ml    Exam  General: Alert and oriented x 3, NAD, uncomfortable   HEENT:  PERRLA, EOMI, Anicteic Sclera, dry mucous membranes   Neck:  Supple, no JVD, no masses  CVS: S1 S2 auscultated, no rubs, murmurs or gallops. Regular rate and rhythm.  Respiratory: Clear to auscultation bilaterally, no wheezing, rales or rhonchi  Abdomen: Soft, diffuse tenderness to palpation , nondistended, + bowel sounds  Ext: no cyanosis clubbing or edema  Neuro: AAOx3, Cr N's II- XII. Strength 5/5 upper and lower extremities bilaterally  Skin: No rashes  Psych: Normal affect and demeanor, alert and oriented x3    Data Review   Micro Results Recent Results (from the past 240 hour(s))  MRSA PCR Screening     Status: None   Collection Time: 11/26/14  1:26 PM  Result Value Ref Range Status   MRSA by PCR NEGATIVE NEGATIVE Final    Comment:        The GeneXpert MRSA Assay (FDA approved for NASAL specimens only), is one component of a comprehensive MRSA colonization surveillance program. It is not intended to diagnose MRSA infection nor to guide or monitor treatment  for MRSA infections.   Clostridium Difficile by PCR     Status: None   Collection Time: 11/26/14  6:38 PM  Result Value Ref Range Status   C difficile by pcr NEGATIVE NEGATIVE Final    Radiology Reports Ct Abdomen Pelvis W Contrast  11/07/2014   CLINICAL DATA:  Abdominal pain with nausea and vomiting  EXAM: CT ABDOMEN AND PELVIS WITH CONTRAST  TECHNIQUE: Multidetector CT imaging of the abdomen and pelvis was performed using the standard protocol following bolus administration of intravenous contrast. Oral contrast was also administered.  CONTRAST:  110m OMNIPAQUE IOHEXOL 300 MG/ML  SOLN  COMPARISON:  October 23, 2014  FINDINGS: Lung bases are clear.  No focal liver lesions are identified. The gallbladder wall is not appreciably thickened. There is no biliary duct dilatation.  Spleen, pancreas, and adrenals appear normal. Kidneys bilaterally show no mass or hydronephrosis. There is no renal or ureteral calculus on either side.  In the pelvis, the urinary bladder is midline with normal wall thickness. There is no pelvic mass or pelvic fluid collection.  There is wall thickening throughout much of the colon with hyperemia in the wall. This finding involves virtually all of the colon and upper rectum and is more extensive than on recent prior study.  There is no bowel obstruction. No free air or portal venous air. The appendix appears normal.  There is no ascites, adenopathy, or abscess in the abdomen or pelvis. There is no evidence of abdominal aortic aneurysm. There are no blastic or lytic bone lesions.  IMPRESSION: Diffuse colitis, most likely inflammatory or infectious etiology. There has been overall progression of colitis compared to the previous study with a more generalized pattern of colitis compared to the previous study. There is no evidence of bowel obstruction or free air. Appendix appears normal. No small bowel abnormality appreciable on this study. Study otherwise unremarkable.    Electronically Signed   By: WLowella GripIII M.D.   On: 11/07/2014 21:30   Dg Abd 2 Views  11/27/2014   CLINICAL DATA:  Abdominal pain for 3 days, history of ulcerative colitis  EXAM: ABDOMEN - 2 VIEW  COMPARISON:  11/07/2014  FINDINGS: Scattered large and small bowel gas is noted. Fecal material is noted within the colon. No obstructive changes are seen. No free air is noted. No bony abnormality is seen.  IMPRESSION: Nonspecific abdomen.   Electronically Signed   By: MInez CatalinaM.D.   On: 11/27/2014 11:37    CBC  Recent Labs Lab 11/23/14 2023 11/26/14 1052 11/26/14 2005 11/27/14 0402 11/27/14 2343 11/28/14 0830  WBC 16.0* 19.8* 12.7* 15.3* 15.2* 10.8*  HGB 10.6* 7.6* 9.4* 8.7* 8.1* 7.2*  HCT 32.1* 22.0* 26.9* 25.0* 23.4* 21.0*  PLT 554* 522* 341 330 447* 400  MCV 81.3 78.9 80.3 80.1 80.4 81.1  MCH 26.8 27.2 28.1 27.9 27.8 27.8  MCHC 33.0 34.5 34.9 34.8 34.6 34.3  RDW 12.9 12.8 13.3 13.5 13.5 13.5  LYMPHSABS 2.9 1.4  --   --   --   --   MONOABS 2.1* 1.8*  --   --   --   --   EOSABS 0.0 0.0  --   --   --   --   BASOSABS 0.0 0.0  --   --   --   --     Chemistries   Recent Labs Lab 11/23/14 2023 11/26/14 1052 11/27/14 0402 11/28/14 0830  NA 128* 123* 130* 130*  K 4.2 4.0 3.7 3.4*  CL 91* 85* 95* 96  CO2 26 27 27 28   GLUCOSE 102* 127* 101* 131*  BUN 8 13 9 7   CREATININE 0.68 0.55 0.54 0.44*  CALCIUM 8.4 7.7* 7.5* 7.2*  AST  --  15 14  --   ALT  --  32 24  --   ALKPHOS  --  52 47  --   BILITOT  --  0.6 0.5  --    ------------------------------------------------------------------------------------------------------------------ estimated creatinine clearance is 110.9 mL/min (by C-G formula based on Cr of 0.44). ------------------------------------------------------------------------------------------------------------------ No results for input(s): HGBA1C in the last 72  hours. ------------------------------------------------------------------------------------------------------------------ No results for input(s): CHOL, HDL, LDLCALC, TRIG, CHOLHDL, LDLDIRECT in the last 72 hours. ------------------------------------------------------------------------------------------------------------------ No results for input(s): TSH, T4TOTAL, T3FREE, THYROIDAB in the last 72 hours.  Invalid input(s): FREET3 ------------------------------------------------------------------------------------------------------------------ No results for input(s): VITAMINB12, FOLATE, FERRITIN, TIBC, IRON, RETICCTPCT in the last 72 hours.  Coagulation profile  Recent Labs Lab 11/27/14 0402  INR 1.26    No results for input(s): DDIMER in the last 72 hours.  Cardiac Enzymes No results for input(s): CKMB, TROPONINI, MYOGLOBIN in the last 168 hours.  Invalid input(s): CK ------------------------------------------------------------------------------------------------------------------ Invalid input(s): POCBNP  No results for input(s): GLUCAP in the last 72 hours.   Capitola Ladson M.D. [facility] 11/28/2014, 11:47 AM  Pager: [phone]   Between 7am to 7pm - call Pager - [phone]  After 7pm go to [email or web address] - password [email or web address]  Call night coverage person covering after 7pm

## 2014-11-29 DIAGNOSIS — E876 Hypokalemia: Secondary | ICD-10-CM

## 2014-11-29 LAB — BASIC METABOLIC PANEL
Anion gap: 8 (ref 5–15)
CHLORIDE: 99 mmol/L (ref 96–112)
CO2: 26 mmol/L (ref 19–32)
Calcium: 7.3 mg/dL — ABNORMAL LOW (ref 8.4–10.5)
Creatinine, Ser: 0.43 mg/dL — ABNORMAL LOW (ref 0.50–1.35)
GFR calc non Af Amer: >90 mL/min (ref >90)
GLUCOSE: 150 mg/dL — AB (ref 70–99)
POTASSIUM: 2.7 mmol/L — AB (ref 3.5–5.1)
Sodium: 133 mmol/L — ABNORMAL LOW (ref 135–145)

## 2014-11-29 LAB — TYPE AND SCREEN
ABO/RH(D): O POS
Antibody Screen: NEGATIVE
UNIT DIVISION: 0
UNIT DIVISION: 0
UNIT DIVISION: 0
Unit division: 0

## 2014-11-29 LAB — CBC
HCT: 30.2 % — ABNORMAL LOW (ref 39.0–52.0)
HCT: 31.2 % — ABNORMAL LOW (ref 39.0–52.0)
HEMOGLOBIN: 10.7 g/dL — AB (ref 13.0–17.0)
Hemoglobin: 10.5 g/dL — ABNORMAL LOW (ref 13.0–17.0)
MCH: 28.5 pg (ref 26.0–34.0)
MCH: 28.2 pg (ref 26.0–34.0)
MCHC: 34.8 g/dL (ref 30.0–36.0)
MCHC: 34.3 g/dL (ref 30.0–36.0)
MCV: 82.1 fL (ref 78.0–100.0)
MCV: 82.3 fL (ref 78.0–100.0)
Platelets: 353 10*3/uL (ref 150–400)
Platelets: 381 10*3/uL (ref 150–400)
RBC: 3.68 MIL/uL — ABNORMAL LOW (ref 4.22–5.81)
RBC: 3.79 MIL/uL — ABNORMAL LOW (ref 4.22–5.81)
RDW: 13.5 % (ref 11.5–15.5)
RDW: 13.6 % (ref 11.5–15.5)
WBC: 13 10*3/uL — ABNORMAL HIGH (ref 4.0–10.5)
WBC: 11.4 10*3/uL — ABNORMAL HIGH (ref 4.0–10.5)

## 2014-11-29 LAB — GI PATHOGEN PANEL BY PCR, STOOL
C difficile toxin A/B: NOT DETECTED
CRYPTOSPORIDIUM BY PCR: NOT DETECTED
Campylobacter by PCR: NOT DETECTED
E coli (ETEC) LT/ST: NOT DETECTED
E coli (STEC): NOT DETECTED
E coli 0157 by PCR: NOT DETECTED
G lamblia by PCR: NOT DETECTED
NOROVIRUS G1/G2: NOT DETECTED
ROTAVIRUS A BY PCR: NOT DETECTED
Salmonella by PCR: NOT DETECTED
Shigella by PCR: NOT DETECTED

## 2014-11-29 LAB — MAGNESIUM: Magnesium: 2.2 mg/dL (ref 1.5–2.5)

## 2014-11-29 LAB — POTASSIUM: Potassium: 3.5 mmol/L (ref 3.5–5.1)

## 2014-11-29 MED ORDER — POTASSIUM CHLORIDE CRYS ER 20 MEQ PO TBCR
40.0000 meq | EXTENDED_RELEASE_TABLET | Freq: Once | ORAL | Status: AC
  Administered 2014-11-29: 40 meq via ORAL
  Filled 2014-11-29: qty 2

## 2014-11-29 MED ORDER — POTASSIUM CHLORIDE 10 MEQ/100ML IV SOLN
10.0000 meq | INTRAVENOUS | Status: AC
  Administered 2014-11-29 (×4): 10 meq via INTRAVENOUS
  Filled 2014-11-29 (×4): qty 100

## 2014-11-29 MED ORDER — POTASSIUM CHLORIDE 10 MEQ/100ML IV SOLN: 10.0000 meq | INTRAVENOUS | Status: DC

## 2014-11-29 MED ORDER — POTASSIUM CHLORIDE CRYS ER 20 MEQ PO TBCR: 30.0000 meq | EXTENDED_RELEASE_TABLET | Freq: Once | ORAL | Status: DC

## 2014-11-29 MED ORDER — POTASSIUM CHLORIDE CRYS ER 20 MEQ PO TBCR: 30.0000 meq | EXTENDED_RELEASE_TABLET | ORAL | Status: DC

## 2014-11-29 MED ORDER — SIMETHICONE 80 MG PO CHEW
80.0000 mg | CHEWABLE_TABLET | ORAL | Status: DC | PRN
  Administered 2014-11-29 – 2014-12-01 (×5): 80 mg via ORAL
  Filled 2014-11-29 (×7): qty 1

## 2014-11-29 NOTE — Progress Notes (Signed)
Subjective: Less abdominal pain. Less diarrhea.  Objective: Vital signs in last 24 hours: Temp:  [97.3 F (36.3 C)-98.6 F (37 C)] 97.3 F (36.3 C) (03/23 0800) Pulse Rate:  [67-86] 71 (03/23 0400) Resp:  [13-17] 14 (03/23 0400) BP: (71-140)/(36-78) 115/59 mmHg (03/23 0400) SpO2:  [98 %-100 %] 99 % (03/23 0400) Weight:  [51.6 kg (113 lb 12.1 oz)] 51.6 kg (113 lb 12.1 oz) (03/23 0324) Weight change: -1.2 kg (-2 lb 10.3 oz) Last BM Date: 11/28/14  PE: GEN:  Deconditioned-appearing, somewhat pale-appearing, but NAD ABD:  Soft, mild generalized tenderness (improving)  Lab Results: CBC    Component Value Date/Time   WBC 11.4* 11/29/2014 0345   RBC 3.79* 11/29/2014 0345   HGB 10.7* 11/29/2014 0345   HCT 31.2* 11/29/2014 0345   PLT 381 11/29/2014 0345   MCV 82.3 11/29/2014 0345   MCH 28.2 11/29/2014 0345   MCHC 34.3 11/29/2014 0345   RDW 13.6 11/29/2014 0345   LYMPHSABS 1.4 11/26/2014 1052   MONOABS 1.8* 11/26/2014 1052   EOSABS 0.0 11/26/2014 1052   BASOSABS 0.0 11/26/2014 1052   CMP     Component Value Date/Time   NA 133* 11/29/2014 0345   K 2.7* 11/29/2014 0345   CL 99 11/29/2014 0345   CO2 26 11/29/2014 0345   GLUCOSE 150* 11/29/2014 0345   BUN <5* 11/29/2014 0345   CREATININE 0.43* 11/29/2014 0345   CALCIUM 7.3* 11/29/2014 0345   PROT 5.1* 11/27/2014 0402   ALBUMIN 1.8* 11/27/2014 0402   AST 14 11/27/2014 0402   ALT 24 11/27/2014 0402   ALKPHOS 47 11/27/2014 0402   BILITOT 0.5 11/27/2014 0402   GFRNONAA >90 11/29/2014 0345   GFRAA >90 11/29/2014 0345   Assessment:  1.  Bloody diarrhea, improving with intravenous steroids.  Suspect UC flare; C. Diff and GI pathogen stool panel negative. 2.  Acute blood loss anemia, likely due to #1 above. 3.  Weakness and deconditioning.  Plan:  1.  Continue IV steroids for today; hopefully will transition to oral prednisone tomorrow. 2.  Slowly advance diet. 3.  Would have social worker look into options for patient  with pharmaceutical assistance; he will likely need a biologic agent, as outlined on yesterday's note. 4.  Might benefit from PT/OT evaluation to make sure patient is well enough to home home in the next couple days. 5.  Will follow.   [name] 11/29/2014, 8:50 AM

## 2014-11-29 NOTE — Progress Notes (Signed)
CSW received referral that pt with limited support, no insurance, and needs assistance with medication.   CSW does not assist with medications, This is and RNCM role and RNCM aware and providing assistance with medications.   CSW contacted financial counselor who was aware of pt and has spoken with pt previous admission  regarding applying for Medicaid.   CSW to follow up to complete full psychosocial assessment.   Alison Murray, MSW, [place] [facility] [phone]

## 2014-11-29 NOTE — Progress Notes (Signed)
[facility]                                                                              Patient Demographics  Spencer Williams, is a 19 y.o. male, DOB - 03/11/1996, IEP:[id number]  Admit date - 11/26/2014   Admitting Physician No admitting provider for patient encounter.  Outpatient Primary MD for the patient is No PCP Per Patient  LOS - 3   Chief Complaint  Patient presents with  . Rectal Bleeding  . Emesis       Brief HPI   Patient is a 19 year old male diagnosed with ulcerative colitis this month. Patient presented with frequent bloody diarrhea, severe abdominal pain, nausea, vomiting. Patient was admitted for further workup   Assessment & Plan    Principal Problem:  Ulcerative Colitis flare: Diarrhea slowing -  on mesalamine and steroid regimen -  GI following,planning to start anti-TNF alpha agents, social work consult placed    Active Problems:   Protein-calorie malnutrition, severe - Continue nutritional supplements    Symptomatic anemia - Hemoglobin improved to 10.7, status post 2 units packed RBC transfusion for hemoglobin 7.2 on 3/22    Hypokalemia - Likely due to continued diarrhea, placed on oral and IV replacement   Generalized weakness - PTOT consult placed  Code Status: Full code  Family Communication: Discussed in detail with the patient, all imaging results, lab results explained to the patient    Disposition Plan: Remains inpatient   Time Spent in minutes   25 minutes  Procedures  Abd Xray  Consults   Gastroenterology  DVT Prophylaxis   SCD's  Medications  Scheduled Meds: . docusate sodium  100 mg Oral BID  . feeding supplement (RESOURCE BREEZE)  1 Container Oral TID BM  . hydrocortisone  100 mg Rectal QHS  . mesalamine  4.8 g Oral Q breakfast  . methylPREDNISolone (SOLU-MEDROL) injection  40 mg Intravenous Q12H  . potassium chloride  10 mEq Intravenous Q1 Hr x 4  . potassium chloride  40 mEq Oral Once  . sodium  chloride  3 mL Intravenous Q12H   Continuous Infusions: . sodium chloride 125 mL/hr at 11/29/14 0717   PRN Meds:.alum & mag hydroxide-simeth, morphine injection, ondansetron **OR** ondansetron (ZOFRAN) IV, polyethylene glycol, simethicone   Antibiotics   Anti-infectives    None        Subjective:   Spencer Williams was seen and examined today.  Patient denies dizziness, chest pain, shortness of breath. Continues to have abdominal pain,  nausea and bloody diarrhea. No acute events overnight.    Objective:   Blood pressure 115/59, pulse 71, temperature 97.3 F (36.3 C), temperature source Oral, resp. rate 14, height 5' 7"  (1.702 m), weight 51.6 kg (113 lb 12.1 oz), SpO2 99 %.  Wt Readings from Last 3 Encounters:  11/29/14 51.6 kg (113 lb 12.1 oz) (1 %*, Z = -2.23)  11/09/14 59.829 kg (131 lb 14.4 oz) (14 %*, Z = -1.07)   * Growth percentiles are based on CDC 2-20 Years data.     Intake/Output Summary (Last 24 hours) at 11/29/14 1033 Last data filed at 11/29/14 0900  Gross per 24 hour  Intake   4035 ml  Output   3625 ml  Net    410 ml    Exam  General: Alert and oriented x 3, NAD   CVS: S1 S2 clear, no mrg Regular rate and rhythm.  Respiratory: CTAB   Abdomen: Soft, diffuse tenderness to palpation , nondistended, + bowel sounds  Ext: no cyanosis clubbing or edema  Neuro: AAOx3, Cr N's II- XII. Strength 5/5 upper and lower extremities bilaterally  Skin: No rashes  Psych: Normal affect and demeanor, alert and oriented x3    Data Review   Micro Results Recent Results (from the past 240 hour(s))  MRSA PCR Screening     Status: None   Collection Time: 11/26/14  1:26 PM  Result Value Ref Range Status   MRSA by PCR NEGATIVE NEGATIVE Final    Comment:        The GeneXpert MRSA Assay (FDA approved for NASAL specimens only), is one component of a comprehensive MRSA colonization surveillance program. It is not intended to diagnose MRSA infection nor to guide  or monitor treatment for MRSA infections.   Clostridium Difficile by PCR     Status: None   Collection Time: 11/26/14  6:38 PM  Result Value Ref Range Status   C difficile by pcr NEGATIVE NEGATIVE Final    Radiology Reports Ct Abdomen Pelvis W Contrast  11/07/2014   CLINICAL DATA:  Abdominal pain with nausea and vomiting  EXAM: CT ABDOMEN AND PELVIS WITH CONTRAST  TECHNIQUE: Multidetector CT imaging of the abdomen and pelvis was performed using the standard protocol following bolus administration of intravenous contrast. Oral contrast was also administered.  CONTRAST:  143m OMNIPAQUE IOHEXOL 300 MG/ML  SOLN  COMPARISON:  October 23, 2014  FINDINGS: Lung bases are clear.  No focal liver lesions are identified. The gallbladder wall is not appreciably thickened. There is no biliary duct dilatation.  Spleen, pancreas, and adrenals appear normal. Kidneys bilaterally show no mass or hydronephrosis. There is no renal or ureteral calculus on either side.  In the pelvis, the urinary bladder is midline with normal wall thickness. There is no pelvic mass or pelvic fluid collection.  There is wall thickening throughout much of the colon with hyperemia in the wall. This finding involves virtually all of the colon and upper rectum and is more extensive than on recent prior study.  There is no bowel obstruction. No free air or portal venous air. The appendix appears normal.  There is no ascites, adenopathy, or abscess in the abdomen or pelvis. There is no evidence of abdominal aortic aneurysm. There are no blastic or lytic bone lesions.  IMPRESSION: Diffuse colitis, most likely inflammatory or infectious etiology. There has been overall progression of colitis compared to the previous study with a more generalized pattern of colitis compared to the previous study. There is no evidence of bowel obstruction or free air. Appendix appears normal. No small bowel abnormality appreciable on this study. Study otherwise  unremarkable.   Electronically Signed   By: WLowella GripIII M.D.   On: 11/07/2014 21:30   Dg Abd 2 Views  11/27/2014   CLINICAL DATA:  Abdominal pain for 3 days, history of ulcerative colitis  EXAM: ABDOMEN - 2 VIEW  COMPARISON:  11/07/2014  FINDINGS: Scattered large and small bowel gas is noted. Fecal material is noted within the colon. No obstructive changes are seen. No free air is noted. No bony abnormality is seen.  IMPRESSION: Nonspecific abdomen.   Electronically Signed  By: Inez Catalina M.D.   On: 11/27/2014 11:37    CBC  Recent Labs Lab 11/23/14 2023 11/26/14 1052  11/27/14 0402 11/27/14 2343 11/28/14 0830 11/28/14 1705 11/29/14 0345  WBC 16.0* 19.8*  < > 15.3* 15.2* 10.8* 13.0* 11.4*  HGB 10.6* 7.6*  < > 8.7* 8.1* 7.2* 10.5* 10.7*  HCT 32.1* 22.0*  < > 25.0* 23.4* 21.0* 30.2* 31.2*  PLT 554* 522*  < > 330 447* 400 353 381  MCV 81.3 78.9  < > 80.1 80.4 81.1 82.1 82.3  MCH 26.8 27.2  < > 27.9 27.8 27.8 28.5 28.2  MCHC 33.0 34.5  < > 34.8 34.6 34.3 34.8 34.3  RDW 12.9 12.8  < > 13.5 13.5 13.5 13.5 13.6  LYMPHSABS 2.9 1.4  --   --   --   --   --   --   MONOABS 2.1* 1.8*  --   --   --   --   --   --   EOSABS 0.0 0.0  --   --   --   --   --   --   BASOSABS 0.0 0.0  --   --   --   --   --   --   < > = values in this interval not displayed.  Chemistries   Recent Labs Lab 11/23/14 2023 11/26/14 1052 11/27/14 0402 11/28/14 0830 11/29/14 0345 11/29/14 0351  NA 128* 123* 130* 130* 133*  --   K 4.2 4.0 3.7 3.4* 2.7*  --   CL 91* 85* 95* 96 99  --   CO2 26 27 27 28 26   --   GLUCOSE 102* 127* 101* 131* 150*  --   BUN 8 13 9 7  <5*  --   CREATININE 0.68 0.55 0.54 0.44* 0.43*  --   CALCIUM 8.4 7.7* 7.5* 7.2* 7.3*  --   MG  --   --   --   --   --  2.2  AST  --  15 14  --   --   --   ALT  --  32 24  --   --   --   ALKPHOS  --  52 47  --   --   --   BILITOT  --  0.6 0.5  --   --   --     ------------------------------------------------------------------------------------------------------------------ estimated creatinine clearance is 108.4 mL/min (by C-G formula based on Cr of 0.43). ------------------------------------------------------------------------------------------------------------------ No results for input(s): HGBA1C in the last 72 hours. ------------------------------------------------------------------------------------------------------------------ No results for input(s): CHOL, HDL, LDLCALC, TRIG, CHOLHDL, LDLDIRECT in the last 72 hours. ------------------------------------------------------------------------------------------------------------------ No results for input(s): TSH, T4TOTAL, T3FREE, THYROIDAB in the last 72 hours.  Invalid input(s): FREET3 ------------------------------------------------------------------------------------------------------------------ No results for input(s): VITAMINB12, FOLATE, FERRITIN, TIBC, IRON, RETICCTPCT in the last 72 hours.  Coagulation profile  Recent Labs Lab 11/27/14 0402  INR 1.26    No results for input(s): DDIMER in the last 72 hours.  Cardiac Enzymes No results for input(s): CKMB, TROPONINI, MYOGLOBIN in the last 168 hours.  Invalid input(s): CK ------------------------------------------------------------------------------------------------------------------ Invalid input(s): POCBNP  No results for input(s): GLUCAP in the last 72 hours.   Emari Hreha M.D. [facility] 11/29/2014, 10:33 AM  Pager: [phone]   Between 7am to 7pm - call Pager - [phone]  After 7pm go to [email or web address] - password [email or web address]  Call night coverage person covering after 7pm

## 2014-11-30 ENCOUNTER — Inpatient Hospital Stay (HOSPITAL_COMMUNITY): Payer: Medicaid Other

## 2014-11-30 LAB — BASIC METABOLIC PANEL
Anion gap: 7 (ref 5–15)
BUN: <5 mg/dL — ABNORMAL LOW (ref 6–23)
CHLORIDE: 99 mmol/L (ref 96–112)
CO2: 27 mmol/L (ref 19–32)
Calcium: 7.7 mg/dL — ABNORMAL LOW (ref 8.4–10.5)
Creatinine, Ser: 0.42 mg/dL — ABNORMAL LOW (ref 0.50–1.35)
GFR calc Af Amer: >90 mL/min (ref >90)
GFR calc non Af Amer: >90 mL/min (ref >90)
Glucose, Bld: 113 mg/dL — ABNORMAL HIGH (ref 70–99)
POTASSIUM: 3.5 mmol/L (ref 3.5–5.1)
SODIUM: 133 mmol/L — AB (ref 135–145)

## 2014-11-30 LAB — CBC
HEMATOCRIT: 30 % — AB (ref 39.0–52.0)
HEMOGLOBIN: 10.3 g/dL — AB (ref 13.0–17.0)
MCH: 28.5 pg (ref 26.0–34.0)
MCHC: 34.3 g/dL (ref 30.0–36.0)
MCV: 83.1 fL (ref 78.0–100.0)
Platelets: 430 10*3/uL — ABNORMAL HIGH (ref 150–400)
RBC: 3.61 MIL/uL — ABNORMAL LOW (ref 4.22–5.81)
RDW: 13.8 % (ref 11.5–15.5)
WBC: 13.3 10*3/uL — ABNORMAL HIGH (ref 4.0–10.5)

## 2014-11-30 MED ORDER — ENSURE ENLIVE PO LIQD
237.0000 mL | Freq: Two times a day (BID) | ORAL | Status: DC
  Administered 2014-11-30: 237 mL via ORAL

## 2014-11-30 NOTE — Evaluation (Signed)
Occupational Therapy Evaluation Patient Details Name: Spencer Williams MRN: [id number] DOB: 12/28/1995 Today's Date: 11/30/2014    History of Present Illness 19 yo male admitted with colitis. Multiple readmissions recently.    Clinical Impression   Pt was admitted for the above.  He will benefit from skilled OT to increase safety and independence with adls.  Pt currently needs min A at this time for ADLs and toilet transfer. Goals are set for supervision to set up level.      Follow Up Recommendations   (assist for mobility; HHOT depending upon progress)    Equipment Recommendations   (to be further assessed, ? 3:1 commode)    Recommendations for Other Services       Precautions / Restrictions Precautions Precautions: Fall Restrictions Weight Bearing Restrictions: No      Mobility Bed Mobility Overal bed mobility: Modified Independent             General bed mobility comments: pt up in chair  Transfers Overall transfer level: Needs assistance Equipment used: Rolling walker (2 wheeled) Transfers: Sit to/from Stand Sit to Stand: Min assist         General transfer comment: Assist to rise, stabilize, control descent. VCs safety, hand placement.     Balance Overall balance assessment: Needs assistance                                          ADL Overall ADL's : Needs assistance/impaired     Grooming: Wash/dry hands;Min guard;Standing           Upper Body Dressing : Minimal assistance;Sitting (IV)                     General ADL Comments: ambulated to bathroom with min A--had one LOB when backing up from commode.  He had stood to urinate with min guard assistance.  Pt limited by pain, and he feels generally weak.  Overall set up for UB adls (needed A due to IV, when changing gown) and min A for LB adls, for balance  Pt is able to flex knees in recliner to reach feet.     Vision     Perception     Praxis      Pertinent  Vitals/Pain Pain Assessment: 0-10 Pain Score: 8  Pain Location: abdomen Pain Descriptors / Indicators: Stabbing Pain Intervention(s): Limited activity within patient's tolerance;Monitored during session;Patient requesting pain meds-RN notified;Repositioned     Hand Dominance     Extremity/Trunk Assessment Upper Extremity Assessment Upper Extremity Assessment: Generalized weakness      Cervical / Trunk Assessment Cervical / Trunk Assessment: Normal   Communication Communication Communication: No difficulties   Cognition Arousal/Alertness: Awake/alert Behavior During Therapy: WFL for tasks assessed/performed Overall Cognitive Status: Within Functional Limits for tasks assessed                     General Comments       Exercises       Shoulder Instructions      Home Living Family/patient expects to be discharged to:: Private residence Living Arrangements: Other relatives Available Help at Discharge: Family Type of Home: Apartment Home Access: Level entry     Home Layout: One level     Bathroom Shower/Tub: Tub/shower unit Shower/tub characteristics: Architectural technologist: Standard     Home Equipment: None  Prior Functioning/Environment Level of Independence: Independent             OT Diagnosis: Generalized weakness;Acute pain   OT Problem List: Decreased strength;Decreased activity tolerance;Impaired balance (sitting and/or standing);Decreased knowledge of use of DME or AE;Pain   OT Treatment/Interventions: Self-care/ADL training;DME and/or AE instruction;Patient/family education;Balance training    OT Goals(Current goals can be found in the care plan section) Acute Rehab OT Goals Patient Stated Goal: less pain. "better/ready before I leave this time" OT Goal Formulation: With patient Time For Goal Achievement: 12/07/14 Potential to Achieve Goals: Good ADL Goals Pt Will Perform Grooming: with supervision;standing Pt Will  Transfer to Toilet: with supervision;ambulating;regular height toilet;with transfer board (vs) Pt Will Perform Toileting - Clothing Manipulation and hygiene: with supervision;sit to/from stand Additional ADL Goal #1: Pt will complete ADL with set up, sit to stand  OT Frequency: Min 2X/week   Barriers to D/C:            Co-evaluation              End of Session Nurse Communication:  (needs min A due to balance)  Activity Tolerance: Patient limited by fatigue;Patient limited by pain Patient left: in chair;with call bell/phone within reach   Time: 1027-1052 OT Time Calculation (min): 25 min Charges:  OT General Charges $OT Visit: 1 Procedure OT Evaluation $Initial OT Evaluation Tier I: 1 Procedure G-Codes:    Mamta Rimmer 12-30-2014, 1:01 PM  Lesle Chris, OTR/L [phone] 12-30-2014

## 2014-11-30 NOTE — Progress Notes (Signed)
Pt stable for transfer to med/surg. Report given to [facility] on 5E.

## 2014-11-30 NOTE — Progress Notes (Signed)
Subjective: Continues to have steady improvement in abdominal pain and bloody diarrhea.  Objective: Vital signs in last 24 hours: Temp:  [98.1 F (36.7 C)-98.7 F (37.1 C)] 98.1 F (36.7 C) (03/24 0941) Pulse Rate:  [73-102] 85 (03/24 0941) Resp:  [13-21] 18 (03/24 0941) BP: (111-133)/(53-65) 117/63 mmHg (03/24 0941) SpO2:  [96 %-99 %] 99 % (03/24 0941) Weight:  [47.1 kg (103 lb 13.4 oz)] 47.1 kg (103 lb 13.4 oz) (03/24 0200) Weight change: -4.5 kg (-9 lb 14.7 oz) Last BM Date: 11/29/14  PE: GEN:  Non toxic-appearing ABD:  Soft, mild tenderness, active bowel sounds, no peritonitis  Lab Results: CBC    Component Value Date/Time   WBC 13.3* 11/30/2014 0350   RBC 3.61* 11/30/2014 0350   HGB 10.3* 11/30/2014 0350   HCT 30.0* 11/30/2014 0350   PLT 430* 11/30/2014 0350   MCV 83.1 11/30/2014 0350   MCH 28.5 11/30/2014 0350   MCHC 34.3 11/30/2014 0350   RDW 13.8 11/30/2014 0350   LYMPHSABS 1.4 11/26/2014 1052   MONOABS 1.8* 11/26/2014 1052   EOSABS 0.0 11/26/2014 1052   BASOSABS 0.0 11/26/2014 1052   CMP     Component Value Date/Time   NA 133* 11/30/2014 0350   K 3.5 11/30/2014 0350   CL 99 11/30/2014 0350   CO2 27 11/30/2014 0350   GLUCOSE 113* 11/30/2014 0350   BUN <5* 11/30/2014 0350   CREATININE 0.42* 11/30/2014 0350   CALCIUM 7.7* 11/30/2014 0350   PROT 5.1* 11/27/2014 0402   ALBUMIN 1.8* 11/27/2014 0402   AST 14 11/27/2014 0402   ALT 24 11/27/2014 0402   ALKPHOS 47 11/27/2014 0402   BILITOT 0.5 11/27/2014 0402   GFRNONAA >90 11/30/2014 0350   GFRAA >90 11/30/2014 0350   Assessment:  1.  Ulcerative colitis flare, improving on intravenous steroids.  C. Diff and GI stool pathogen panel negative. 2.  Acute blood loss anemia, likely from #2 above. 3.  Generalized weakness and deconditioning, likely from #2 and #3 above.  Plan:  1.  Transition from intravenous steroids to oral prednisone today. 2.  Patient would highly likely benefit from biologic agent for  his ulcerative colitis; I doubt, given the aggressiveness of his disease histologically, his young age and refractory symptoms meriting hospitalization, that mesalamine agents will be adequate for him to induce and maintain remission.  I have advised Simponi (golimumab); I have provided patient with pharmaceutical assistance paperwork to help him get this medication.  Risks and benefits of medication, including lymphoma and infectious predilection, were explained to patient.  Will check TB testing (quantiferon gold), hepatitis B studies, and CXR, in anticipation of starting Simponi in near future as outpatient. 3.  For now, gradually advance diet, but would favor nothing more than soft diet. 4.  Agree with PT evaluation. 5.  Hopefully hope in the next day or two. 6.  [facility] will follow; I will follow-up with patient as outpatient upon his discharge.   [name] 11/30/2014, 10:10 AM

## 2014-11-30 NOTE — Progress Notes (Signed)
[facility]                                                                              Patient Demographics  Spencer Williams, is a 19 y.o. male, DOB - 10/01/1995, CHE:[id number]  Admit date - 11/26/2014   Admitting Physician Antonius Hartlage Krystal Eaton, MD  Outpatient Primary MD for the patient is No PCP Per Patient  LOS - 4   Chief Complaint  Patient presents with  . Rectal Bleeding  . Emesis       Brief HPI   Patient is a 19 year old male diagnosed with ulcerative colitis this month. Patient presented with frequent bloody diarrhea, severe abdominal pain, nausea, vomiting. Patient was admitted for further workup   Assessment & Plan    Principal Problem:  Ulcerative Colitis flare: Diarrhea improving -  on mesalamine and steroid regimen -  GI following,planning to start anti-TNF alpha agents, case manager  - Able to tolerate full liquid diet, diet advanced to soft solids today  Active Problems:   Protein-calorie malnutrition, severe - Continue nutritional supplements    Symptomatic anemia - Hemoglobin stable, status post 2 units packed RBC transfusion for hemoglobin 7.2 on 3/22    Hypokalemia - Resolved after oral and IV replacement   Generalized weakness - PTOT consult placed-> home health PTOT  Code Status: Full code  Family Communication: Discussed in detail with the patient, all imaging results, lab results explained to the patient    Disposition Plan: Transfer to [facility] today, DC in a.m.  Time Spent in minutes   25 minutes  Procedures  Abd Xray  Consults   Gastroenterology  DVT Prophylaxis   SCD's  Medications  Scheduled Meds: . feeding supplement (ENSURE ENLIVE)  237 mL Oral BID WC  . feeding supplement (RESOURCE BREEZE)  1 Container Oral TID BM  . hydrocortisone  100 mg Rectal QHS  . mesalamine  4.8 g Oral Q breakfast  . methylPREDNISolone (SOLU-MEDROL) injection  40 mg Intravenous Q12H  . sodium chloride  3 mL Intravenous Q12H    Continuous Infusions: . sodium chloride 75 mL/hr at 11/29/14 1856   PRN Meds:.alum & mag hydroxide-simeth, morphine injection, ondansetron **OR** ondansetron (ZOFRAN) IV, simethicone   Antibiotics   Anti-infectives    None        Subjective:   Spencer Williams was seen and examined today. Diarrhea slowing down, able to tolerate full liquid diet. Abdominal pain is also improving, no fevers or chills  Objective:   Blood pressure 117/63, pulse 85, temperature 98.1 F (36.7 C), temperature source Oral, resp. rate 18, height 5' 7"  (1.702 m), weight 47.1 kg (103 lb 13.4 oz), SpO2 99 %.  Wt Readings from Last 3 Encounters:  11/30/14 47.1 kg (103 lb 13.4 oz) (0 %*, Z = -3.03)  11/09/14 59.829 kg (131 lb 14.4 oz) (14 %*, Z = -1.07)   * Growth percentiles are based on CDC 2-20 Years data.     Intake/Output Summary (Last 24 hours) at 11/30/14 1238 Last data filed at 11/30/14 0700  Gross per 24 hour  Intake   1425 ml  Output   1825 ml  Net   -400 ml  Exam  General: Alert and oriented x 3, NAD   CVS: S1 S2 clear, no mrg Regular rate and rhythm.  Respiratory: CTAB   Abdomen: Soft, diffuse tenderness to palpation improving, nondistended, + bowel sounds  Ext: no cyanosis clubbing or edema  Neuro: AAOx3, Cr N's II- XII. Strength 5/5 upper and lower extremities bilaterally  Skin: No rashes  Psych: Normal affect and demeanor, alert and oriented x3    Data Review   Micro Results Recent Results (from the past 240 hour(s))  MRSA PCR Screening     Status: None   Collection Time: 11/26/14  1:26 PM  Result Value Ref Range Status   MRSA by PCR NEGATIVE NEGATIVE Final    Comment:        The GeneXpert MRSA Assay (FDA approved for NASAL specimens only), is one component of a comprehensive MRSA colonization surveillance program. It is not intended to diagnose MRSA infection nor to guide or monitor treatment for MRSA infections.   Clostridium Difficile by PCR      Status: None   Collection Time: 11/26/14  6:38 PM  Result Value Ref Range Status   C difficile by pcr NEGATIVE NEGATIVE Final    Radiology Reports Ct Abdomen Pelvis W Contrast  11/07/2014   CLINICAL DATA:  Abdominal pain with nausea and vomiting  EXAM: CT ABDOMEN AND PELVIS WITH CONTRAST  TECHNIQUE: Multidetector CT imaging of the abdomen and pelvis was performed using the standard protocol following bolus administration of intravenous contrast. Oral contrast was also administered.  CONTRAST:  129m OMNIPAQUE IOHEXOL 300 MG/ML  SOLN  COMPARISON:  October 23, 2014  FINDINGS: Lung bases are clear.  No focal liver lesions are identified. The gallbladder wall is not appreciably thickened. There is no biliary duct dilatation.  Spleen, pancreas, and adrenals appear normal. Kidneys bilaterally show no mass or hydronephrosis. There is no renal or ureteral calculus on either side.  In the pelvis, the urinary bladder is midline with normal wall thickness. There is no pelvic mass or pelvic fluid collection.  There is wall thickening throughout much of the colon with hyperemia in the wall. This finding involves virtually all of the colon and upper rectum and is more extensive than on recent prior study.  There is no bowel obstruction. No free air or portal venous air. The appendix appears normal.  There is no ascites, adenopathy, or abscess in the abdomen or pelvis. There is no evidence of abdominal aortic aneurysm. There are no blastic or lytic bone lesions.  IMPRESSION: Diffuse colitis, most likely inflammatory or infectious etiology. There has been overall progression of colitis compared to the previous study with a more generalized pattern of colitis compared to the previous study. There is no evidence of bowel obstruction or free air. Appendix appears normal. No small bowel abnormality appreciable on this study. Study otherwise unremarkable.   Electronically Signed   By: WLowella GripIII M.D.   On: 11/07/2014  21:30   Dg Abd 2 Views  11/27/2014   CLINICAL DATA:  Abdominal pain for 3 days, history of ulcerative colitis  EXAM: ABDOMEN - 2 VIEW  COMPARISON:  11/07/2014  FINDINGS: Scattered large and small bowel gas is noted. Fecal material is noted within the colon. No obstructive changes are seen. No free air is noted. No bony abnormality is seen.  IMPRESSION: Nonspecific abdomen.   Electronically Signed   By: MInez CatalinaM.D.   On: 11/27/2014 11:37    CBC  Recent Labs Lab 11/23/14  2023 11/26/14 1052  11/27/14 2343 11/28/14 0830 11/28/14 1705 11/29/14 0345 11/30/14 0350  WBC 16.0* 19.8*  < > 15.2* 10.8* 13.0* 11.4* 13.3*  HGB 10.6* 7.6*  < > 8.1* 7.2* 10.5* 10.7* 10.3*  HCT 32.1* 22.0*  < > 23.4* 21.0* 30.2* 31.2* 30.0*  PLT 554* 522*  < > 447* 400 353 381 430*  MCV 81.3 78.9  < > 80.4 81.1 82.1 82.3 83.1  MCH 26.8 27.2  < > 27.8 27.8 28.5 28.2 28.5  MCHC 33.0 34.5  < > 34.6 34.3 34.8 34.3 34.3  RDW 12.9 12.8  < > 13.5 13.5 13.5 13.6 13.8  LYMPHSABS 2.9 1.4  --   --   --   --   --   --   MONOABS 2.1* 1.8*  --   --   --   --   --   --   EOSABS 0.0 0.0  --   --   --   --   --   --   BASOSABS 0.0 0.0  --   --   --   --   --   --   < > = values in this interval not displayed.  Chemistries   Recent Labs Lab 11/26/14 1052 11/27/14 0402 11/28/14 0830 11/29/14 0345 11/29/14 0351 11/29/14 1605 11/30/14 0350  NA 123* 130* 130* 133*  --   --  133*  K 4.0 3.7 3.4* 2.7*  --  3.5 3.5  CL 85* 95* 96 99  --   --  99  CO2 27 27 28 26   --   --  27  GLUCOSE 127* 101* 131* 150*  --   --  113*  BUN 13 9 7  <5*  --   --  <5*  CREATININE 0.55 0.54 0.44* 0.43*  --   --  0.42*  CALCIUM 7.7* 7.5* 7.2* 7.3*  --   --  7.7*  MG  --   --   --   --  2.2  --   --   AST 15 14  --   --   --   --   --   ALT 32 24  --   --   --   --   --   ALKPHOS 52 47  --   --   --   --   --   BILITOT 0.6 0.5  --   --   --   --   --     ------------------------------------------------------------------------------------------------------------------ estimated creatinine clearance is 98.9 mL/min (by C-G formula based on Cr of 0.42). ------------------------------------------------------------------------------------------------------------------ No results for input(s): HGBA1C in the last 72 hours. ------------------------------------------------------------------------------------------------------------------ No results for input(s): CHOL, HDL, LDLCALC, TRIG, CHOLHDL, LDLDIRECT in the last 72 hours. ------------------------------------------------------------------------------------------------------------------ No results for input(s): TSH, T4TOTAL, T3FREE, THYROIDAB in the last 72 hours.  Invalid input(s): FREET3 ------------------------------------------------------------------------------------------------------------------ No results for input(s): VITAMINB12, FOLATE, FERRITIN, TIBC, IRON, RETICCTPCT in the last 72 hours.  Coagulation profile  Recent Labs Lab 11/27/14 0402  INR 1.26    No results for input(s): DDIMER in the last 72 hours.  Cardiac Enzymes No results for input(s): CKMB, TROPONINI, MYOGLOBIN in the last 168 hours.  Invalid input(s): CK ------------------------------------------------------------------------------------------------------------------ Invalid input(s): POCBNP  No results for input(s): GLUCAP in the last 72 hours.   Caellum Mancil M.D. [facility] 11/30/2014, 12:38 PM  Pager: [phone]   Between 7am to 7pm - call Pager - [phone]  After 7pm go to [email or web address] - password [facility]  Call  night coverage person covering after 7pm

## 2014-11-30 NOTE — Evaluation (Signed)
Physical Therapy Evaluation Patient Details Name: Spencer Williams MRN: [id number] DOB: [birth year] Today's Date: 11/30/2014   History of Present Illness  18 yo male admitted with colitis. Multiple readmissions recently.   Clinical Impression  On eval, pt was Min assist for mobility-able to ambulate ~25 feet with RW. Pt likely could have walked much farther but he was not motivated to do so. Vitals during session: BP 128/64, HR 67 bpm, O2: 99% RA. Encouraged pt to mobilize more with nursing assistance and RW if needed.    Follow Up Recommendations Home health PT;Supervision for mobility/OOB    Equipment Recommendations  Rolling walker with 5" wheels (possibly/depending on progress)    Recommendations for Other Services       Precautions / Restrictions Precautions Precautions: Fall Restrictions Weight Bearing Restrictions: No      Mobility  Bed Mobility Overal bed mobility: Modified Independent             General bed mobility comments: HOB elevated. Increased time.   Transfers Overall transfer level: Needs assistance Equipment used: Rolling walker (2 wheeled) Transfers: Sit to/from Stand Sit to Stand: Min assist         General transfer comment: Assist to rise, stabilize, control descent. VCs safety, hand placement.   Ambulation/Gait Ambulation/Gait assistance: Min guard Ambulation Distance (Feet): 25 Feet Assistive device: Rolling walker (2 wheeled) Gait Pattern/deviations: Step-through pattern;Decreased stride length;Decreased step length - right;Decreased step length - left;Trunk flexed     General Gait Details: close guard for safety. Max encouragement for pt to progress activity. Pt c/o fatigue, dizziness, pain during ambulation.   Stairs            Wheelchair Mobility    Modified Rankin (Stroke Patients Only)       Balance Overall balance assessment: Needs assistance                                           Pertinent  Vitals/Pain Pain Assessment: 0-10 Pain Score: 7  (4 at rest; 7 with activity) Pain Location: abdomen Pain Descriptors / Indicators: Stabbing Pain Intervention(s): Limited activity within patient's tolerance    Home Living Family/patient expects to be discharged to:: Private residence Living Arrangements: Other relatives   Type of Home: Apartment Home Access: Level entry     Home Layout: One level Home Equipment: None      Prior Function Level of Independence: Independent               Hand Dominance        Extremity/Trunk Assessment   Upper Extremity Assessment: Defer to OT evaluation           Lower Extremity Assessment: Overall WFL for tasks assessed (pt not putting forth full effort for MMT. Overall strength is [facility])      Cervical / Trunk Assessment: Normal  Communication   Communication: No difficulties  Cognition Arousal/Alertness: Awake/alert Behavior During Therapy: WFL for tasks assessed/performed Overall Cognitive Status: Within Functional Limits for tasks assessed                      General Comments      Exercises        Assessment/Plan    PT Assessment Patient needs continued PT services  PT Diagnosis Generalized weakness;Difficulty walking;Acute pain   PT Problem List Decreased strength;Decreased activity tolerance;Decreased balance;Decreased mobility;Pain;Decreased knowledge of use  of DME  PT Treatment Interventions DME instruction;Gait training;Functional mobility training;Therapeutic activities;Therapeutic exercise;Patient/family education;Balance training   PT Goals (Current goals can be found in the Care Plan section) Acute Rehab PT Goals Patient Stated Goal: less pain. "better/ready before I leave this time" PT Goal Formulation: With patient Time For Goal Achievement: 12/14/14 Potential to Achieve Goals: Good    Frequency Min 3X/week   Barriers to discharge        Co-evaluation               End of  Session Equipment Utilized During Treatment: Gait belt Activity Tolerance: Patient limited by fatigue;Patient limited by pain Patient left: in chair;with call bell/phone within reach           Time: 0955-1020 PT Time Calculation (min) (ACUTE ONLY): 25 min   Charges:     PT Treatments $Gait Training: 8-22 mins   PT G Codes:        Weston Anna, MPT Pager: [phone]

## 2014-12-01 LAB — CBC
HCT: 27.2 % — ABNORMAL LOW (ref 39.0–52.0)
Hemoglobin: 9.4 g/dL — ABNORMAL LOW (ref 13.0–17.0)
MCH: 28.5 pg (ref 26.0–34.0)
MCHC: 34.6 g/dL (ref 30.0–36.0)
MCV: 82.4 fL (ref 78.0–100.0)
PLATELETS: 409 10*3/uL — AB (ref 150–400)
RBC: 3.3 MIL/uL — ABNORMAL LOW (ref 4.22–5.81)
RDW: 13.7 % (ref 11.5–15.5)
WBC: 10.1 10*3/uL (ref 4.0–10.5)

## 2014-12-01 LAB — BASIC METABOLIC PANEL
Anion gap: 7 (ref 5–15)
BUN: 8 mg/dL (ref 6–23)
CO2: 29 mmol/L (ref 19–32)
Calcium: 7.4 mg/dL — ABNORMAL LOW (ref 8.4–10.5)
Chloride: 97 mmol/L (ref 96–112)
Creatinine, Ser: 0.36 mg/dL — ABNORMAL LOW (ref 0.50–1.35)
GFR calc Af Amer: >90 mL/min (ref >90)
Glucose, Bld: 138 mg/dL — ABNORMAL HIGH (ref 70–99)
Potassium: 3.4 mmol/L — ABNORMAL LOW (ref 3.5–5.1)
Sodium: 133 mmol/L — ABNORMAL LOW (ref 135–145)

## 2014-12-01 LAB — HEPATITIS B SURFACE ANTIGEN: HEP B S AG: NEGATIVE

## 2014-12-01 LAB — HEPATITIS B CORE ANTIBODY, TOTAL: Hep B Core Total Ab: NEGATIVE

## 2014-12-01 LAB — HEPATITIS B SURFACE ANTIBODY,QUALITATIVE: Hep B S Ab: NONREACTIVE

## 2014-12-01 MED ORDER — POTASSIUM CHLORIDE CRYS ER 20 MEQ PO TBCR
40.0000 meq | EXTENDED_RELEASE_TABLET | Freq: Once | ORAL | Status: AC
  Administered 2014-12-01: 40 meq via ORAL
  Filled 2014-12-01: qty 2

## 2014-12-01 MED ORDER — LORAZEPAM 1 MG PO TABS
1.0000 mg | ORAL_TABLET | Freq: Once | ORAL | Status: AC
  Administered 2014-12-01: 1 mg via ORAL
  Filled 2014-12-01: qty 1

## 2014-12-01 MED ORDER — OXYCODONE HCL 5 MG PO TABS
5.0000 mg | ORAL_TABLET | ORAL | Status: DC | PRN
  Administered 2014-12-01 – 2014-12-02 (×4): 5 mg via ORAL
  Filled 2014-12-01 (×4): qty 1

## 2014-12-01 NOTE — Progress Notes (Signed)
CARE MANAGEMENT NOTE 12/01/2014  Patient:  Spencer Williams, Spencer Williams   Account Number:  [id number]  Date Initiated:  11/27/2014  Documentation initiated by:  DAVIS,RHONDA  Subjective/Objective Assessment:   newly diagnosed ulcerative pancolitis, colonoscopically and histologically confirmed     Action/Plan:   home when stable   Anticipated DC Date:  12/01/2014   Anticipated DC Plan:  HOME/SELF CARE  In-house referral  Development worker, community  PCP / [place]  CM consult  Medication [facility]      [facility] Choice  NA   Choice offered to / List presented to:  NA           Status of service:  Completed, signed off Medicare Important Message given?   (If response is "NO", the following Medicare IM given date fields will be blank) Date Medicare IM given:   Medicare IM given by:   Date Additional Medicare IM given:   Additional Medicare IM given by:    Discharge Disposition:    Per UR Regulation:  Reviewed for med. necessity/level of care/duration of stay  If discussed at [place] of Stay Meetings, dates discussed:    Comments:  11/30/14 [place] [phone] Patient stated that he lives at home with his younger brother and they do not drive but use taxi's for transportation. He stated that he has been in the country 8 months and is from [place]. He states that he is a [place] citizen. Provided the patient with a [facility] pamphlet and encouraged him to make a follow up appointment with doctor and with financial counselor for resources. Assisted patient will filling out the patient assist app from GI. 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  [id number]/FBXUXY Davis,RN,BSn,CCM/ pt moved out of step down to acute care floor. [id number]/BTYOMA Davis,RN,BSN,CCM: Information for free medication assistance and how to obtain pcp through the [facility] and Well[facility] outpt clinic given and  reviewed with patient.  November 27, 2014/Rhonda L. Rosana Hoes, RN, BSN, CCM. Case Management [place] [phone] No discharge needs present of time of review.

## 2014-12-01 NOTE — Progress Notes (Signed)
[facility] Progress Note  Subjective: Abdominal pain and bloody diarrhea persistent but improving. IV morphine discontinued  Objective: Vital signs in last 24 hours: Temp:  [98.1 F (36.7 C)-98.8 F (37.1 C)] 98.8 F (37.1 C) (03/25 0546) Pulse Rate:  [72-85] 78 (03/25 0546) Resp:  [16-18] 16 (03/25 0546) BP: (110-130)/(58-68) 110/58 mmHg (03/25 0546) SpO2:  [98 %-100 %] 98 % (03/25 0546) Weight change:    PE: Abdomen soft mildly tender  Lab Results: Results for orders placed or performed during the hospital encounter of 11/26/14 (from the past 24 hour(s))  Hepatitis B surface antigen     Status: None   Collection Time: 11/30/14 11:42 AM  Result Value Ref Range   Hepatitis B Surface Ag NEGATIVE NEGATIVE  Hepatitis B surface antibody     Status: None   Collection Time: 11/30/14 11:42 AM  Result Value Ref Range   Hep B S Ab Non Reactive   Hepatitis B core antibody, total     Status: None   Collection Time: 11/30/14 11:42 AM  Result Value Ref Range   Hep B Core Total Ab Negative Negative  CBC     Status: Abnormal   Collection Time: 12/01/14  5:43 AM  Result Value Ref Range   WBC 10.1 4.0 - 10.5 K/uL   RBC 3.30 (L) 4.22 - 5.81 MIL/uL   Hemoglobin 9.4 (L) 13.0 - 17.0 g/dL   HCT 27.2 (L) 39.0 - 52.0 %   MCV 82.4 78.0 - 100.0 fL   MCH 28.5 26.0 - 34.0 pg   MCHC 34.6 30.0 - 36.0 g/dL   RDW 13.7 11.5 - 15.5 %   Platelets 409 (H) 150 - 400 K/uL  Basic metabolic panel     Status: Abnormal   Collection Time: 12/01/14  5:43 AM  Result Value Ref Range   Sodium 133 (L) 135 - 145 mmol/L   Potassium 3.4 (L) 3.5 - 5.1 mmol/L   Chloride 97 96 - 112 mmol/L   CO2 29 19 - 32 mmol/L   Glucose, Bld 138 (H) 70 - 99 mg/dL   BUN 8 6 - 23 mg/dL   Creatinine, Ser 0.36 (L) 0.50 - 1.35 mg/dL   Calcium 7.4 (L) 8.4 - 10.5 mg/dL   GFR calc non Af Amer >90 >90 mL/min   GFR calc Af Amer >90 >90 mL/min   Anion gap 7 5 - 15    Studies/Results: Dg Chest 2 View  11/30/2014    CLINICAL DATA:  Weakness, screening for tuberculosis  EXAM: CHEST  2 VIEW  COMPARISON:  None.  FINDINGS: No infiltrate or effusion is seen. Some peribronchial thickening is noted which may indicate bronchitis. No sequela of prior tuberculous infection is seen. Mediastinal and hilar contours are unremarkable. The heart is within normal limits in size. No bony abnormality is seen.  IMPRESSION: No active cardiopulmonary disease. Peribronchial thickening may indicate bronchitis   Electronically Signed   By: Ivar Drape M.D.   On: 11/30/2014 15:30      Assessment: Recent onset of ulcerative colitis requiring hospitalization appears to be improving on IV Solu-Medrol stool pathogen panel negative. Hepatitis B studies negative, TB studies pending  Plan: Okay to switch to oral prednisone 40 mg a day. If tolerates weaning of IV pain medicine and prednisone, hopefully home in the next day or 2 to be followed up by Dr. Paulita Fujita and if obtainable, start on the biological agent simponi    Herold Salguero C 12/01/2014, 8:50 AM

## 2014-12-01 NOTE — Progress Notes (Signed)
Physical Therapy Treatment Patient Details Name: Devynn Scheff MRN: [id number] DOB: 1996/02/06 Today's Date: 12/01/2014    History of Present Illness 19 yo male admitted with colitis. Multiple readmissions recently.     PT Comments    Effort improved somewhat on today. Pt still not very motivated to progress mobility. Continued to encourage pt to ambulate in hallways often, with nursing supervision. Pt was Min guard assist for ambulation distance of ~135' on today while only holding on to IV pole. Overall strength and mobility will likely improve if pt will increase activity.   Follow Up Recommendations  No PT follow up;Supervision/assist for mobility/OOB     Equipment Recommendations  None recommended by PT    Recommendations for Other Services OT consult     Precautions / Restrictions Precautions Precautions: Fall Restrictions Weight Bearing Restrictions: No    Mobility  Bed Mobility Overal bed mobility: Modified Independent                Transfers Overall transfer level: Modified independent                  Ambulation/Gait Ambulation/Gait assistance: Min guard Ambulation Distance (Feet): 135 Feet Assistive device:  (IV pole) Gait Pattern/deviations: Decreased stride length;Decreased step length - right;Decreased step length - left     General Gait Details: close guard for safety. Pt felt more comfortable taking smaller steps so I allowed this. No c/o dizziness or increased pain this session.    Stairs            Wheelchair Mobility    Modified Rankin (Stroke Patients Only)       Balance           Standing balance support: During functional activity;Single extremity supported Standing balance-Leahy Scale: Fair                      Cognition Arousal/Alertness: Awake/alert Behavior During Therapy: WFL for tasks assessed/performed Overall Cognitive Status: Within Functional Limits for tasks assessed                      Exercises      General Comments        Pertinent Vitals/Pain Pain Assessment: 0-10 Pain Score: 4  (per pt, did not increase with activity today) Pain Location: abdomen Pain Descriptors / Indicators: Sore Pain Intervention(s): Monitored during session    Home Living                      Prior Function            PT Goals (current goals can now be found in the care plan section) Progress towards PT goals: Progressing toward goals    Frequency  Min 3X/week    PT Plan Current plan remains appropriate    Co-evaluation             End of Session Equipment Utilized During Treatment: Gait belt Activity Tolerance: Patient tolerated treatment well Patient left: in bed;with bed alarm set     Time: 0954-1010 PT Time Calculation (min) (ACUTE ONLY): 16 min  Charges:  $Gait Training: 8-22 mins                    G Codes:      Weston Anna, MPT Pager: [phone]

## 2014-12-01 NOTE — Progress Notes (Signed)
[facility]                                                                              Patient Demographics  Spencer Williams, is a 19 y.o. male, DOB - Jan 10, 1996, KGY:[id number]  Admit date - 11/26/2014   Admitting Physician Spencer Attridge Krystal Eaton, MD  Outpatient Primary MD for the patient is No PCP Per Patient  LOS - 5   Chief Complaint  Patient presents with  . Rectal Bleeding  . Emesis       Brief HPI   Patient is a 19 year old male diagnosed with ulcerative colitis this month. Patient presented with frequent bloody diarrhea, severe abdominal pain, nausea, vomiting. Patient was admitted for further workup   Assessment & Plan    Principal Problem:  Ulcerative Colitis flare: Acute recently diagnosed UC, Overall significant improving, no nausea, vomiting, and bloody diarrhea has significantly improved  -  on mesalamine and steroid regimen -  GI following, planning to start anti-TNF alpha agents outpatient - I have discontinued IV morphine, patient is very demotivated and asking for IV pain medications, does not want to work with physical therapy although at the time of my encounter before entering the room patient was comfortably working on his laptop. He can have oral oxycodone as needed. -Discussed in detail with case management regarding Medicaid legibility for the patient he will benefit from [facility] appointment. He will need appointment with GI.  - Hepatitis panel negative  Active Problems:   Protein-calorie malnutrition, severe - Continue nutritional supplements    Symptomatic anemia - Hemoglobin stable, status post 2 units packed RBC transfusion for hemoglobin 7.2 on 3/22    Hypokalemia -Replaced   Generalized weakness - PTOT consult placed-> home health PTOT  Social issues Patient is very demotivated and disengaging in conversation with somewhat flat affect. After significant effort and questioning, I was able to understand his  social situation somewhat. Patient reports that he is actually [place] citizen (his father is [place] citizen) but he was living in [place] and [place], came to [place] 8 months ago. 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 He lives at home with his younger brother who is 93 years old. Patient reports that he does not have a driver's license yet and nor his brother. Patient forced that his father and the rest of the family lives in [place]. He feels that he will not be able to manage by himself and does not know the [place] system and healthcare. I told him about the Medicaid and case management assistance, community [facility] appointment. He will need transportation to his appointments and has been taking cabs so far.    Code Status: Full code  Family Communication: Discussed in detail with the patient, all imaging results, lab results explained to the patient    Disposition Plan: DC in am if he is able to tolerate oral pain medications   Time Spent in minutes   25 minutes  Procedures  Abd Xray  Consults   Gastroenterology  DVT Prophylaxis   SCD's  Medications  Scheduled Meds: . feeding supplement (ENSURE  ENLIVE)  237 mL Oral BID WC  . feeding supplement (RESOURCE BREEZE)  1 Container Oral TID BM  . hydrocortisone  100 mg Rectal QHS  . mesalamine  4.8 g Oral Q breakfast  . methylPREDNISolone (SOLU-MEDROL) injection  40 mg Intravenous Q12H  . sodium chloride  3 mL Intravenous Q12H   Continuous Infusions: . sodium chloride 75 mL/hr at 11/30/14 2239   PRN Meds:.alum & mag hydroxide-simeth, ondansetron **OR** ondansetron (ZOFRAN) IV, oxyCODONE, simethicone   Antibiotics   Anti-infectives    None        Subjective:   Spencer Williams was seen and examined today.  diarrhea has improved greatly slowed down, tolerating solid diet without any difficulty. Does not appear to be in significant pain although requesting for IV pain  medications. Patient was working on his computer comfortably when I entered the room.   Objective:   Blood pressure 110/58, pulse 78, temperature 98.8 F (37.1 C), temperature source Oral, resp. rate 16, height 5' 7"  (1.702 m), weight 47.1 kg (103 lb 13.4 oz), SpO2 98 %.  Wt Readings from Last 3 Encounters:  11/30/14 47.1 kg (103 lb 13.4 oz) (0 %*, Z = -3.03)  11/09/14 59.829 kg (131 lb 14.4 oz) (14 %*, Z = -1.07)   * Growth percentiles are based on CDC 2-20 Years data.     Intake/Output Summary (Last 24 hours) at 12/01/14 1140 Last data filed at 12/01/14 0917  Gross per 24 hour  Intake   2040 ml  Output    400 ml  Net   1640 ml    Exam  General: Alert and oriented x 3, NAD   CVS: S1 S2 clear, no mrg Regular rate and rhythm.  Respiratory: CTAB   Abdomen: Soft, mild diffuse tenderness to palpation (not sure how much is subjective), nondistended, + bowel sounds   Ext: no cyanosis clubbing or edema  Neuro: AAOx3, Cr N's II- XII. Strength 5/5 upper and lower extremities bilaterally  Skin: No rashes  Psych: Alert and oriented 3, flat affect   Data Review   Micro Results Recent Results (from the past 240 hour(s))  MRSA PCR Screening     Status: None   Collection Time: 11/26/14  1:26 PM  Result Value Ref Range Status   MRSA by PCR NEGATIVE NEGATIVE Final    Comment:        The GeneXpert MRSA Assay (FDA approved for NASAL specimens only), is one component of a comprehensive MRSA colonization surveillance program. It is not intended to diagnose MRSA infection nor to guide or monitor treatment for MRSA infections.   Clostridium Difficile by PCR     Status: None   Collection Time: 11/26/14  6:38 PM  Result Value Ref Range Status   C difficile by pcr NEGATIVE NEGATIVE Final    Radiology Reports Dg Chest 2 View  11/30/2014   CLINICAL DATA:  Weakness, screening for tuberculosis  EXAM: CHEST  2 VIEW  COMPARISON:  None.  FINDINGS: No infiltrate or effusion is  seen. Some peribronchial thickening is noted which may indicate bronchitis. No sequela of prior tuberculous infection is seen. Mediastinal and hilar contours are unremarkable. The heart is within normal limits in size. No bony abnormality is seen.  IMPRESSION: No active cardiopulmonary disease. Peribronchial thickening may indicate bronchitis   Electronically Signed   By: Ivar Drape M.D.   On: 11/30/2014 15:30   Ct Abdomen Pelvis W Contrast  11/07/2014   CLINICAL DATA:  Abdominal pain with  nausea and vomiting  EXAM: CT ABDOMEN AND PELVIS WITH CONTRAST  TECHNIQUE: Multidetector CT imaging of the abdomen and pelvis was performed using the standard protocol following bolus administration of intravenous contrast. Oral contrast was also administered.  CONTRAST:  147m OMNIPAQUE IOHEXOL 300 MG/ML  SOLN  COMPARISON:  October 23, 2014  FINDINGS: Lung bases are clear.  No focal liver lesions are identified. The gallbladder wall is not appreciably thickened. There is no biliary duct dilatation.  Spleen, pancreas, and adrenals appear normal. Kidneys bilaterally show no mass or hydronephrosis. There is no renal or ureteral calculus on either side.  In the pelvis, the urinary bladder is midline with normal wall thickness. There is no pelvic mass or pelvic fluid collection.  There is wall thickening throughout much of the colon with hyperemia in the wall. This finding involves virtually all of the colon and upper rectum and is more extensive than on recent prior study.  There is no bowel obstruction. No free air or portal venous air. The appendix appears normal.  There is no ascites, adenopathy, or abscess in the abdomen or pelvis. There is no evidence of abdominal aortic aneurysm. There are no blastic or lytic bone lesions.  IMPRESSION: Diffuse colitis, most likely inflammatory or infectious etiology. There has been overall progression of colitis compared to the previous study with a more generalized pattern of colitis  compared to the previous study. There is no evidence of bowel obstruction or free air. Appendix appears normal. No small bowel abnormality appreciable on this study. Study otherwise unremarkable.   Electronically Signed   By: WLowella GripIII M.D.   On: 11/07/2014 21:30   Dg Abd 2 Views  11/27/2014   CLINICAL DATA:  Abdominal pain for 3 days, history of ulcerative colitis  EXAM: ABDOMEN - 2 VIEW  COMPARISON:  11/07/2014  FINDINGS: Scattered large and small bowel gas is noted. Fecal material is noted within the colon. No obstructive changes are seen. No free air is noted. No bony abnormality is seen.  IMPRESSION: Nonspecific abdomen.   Electronically Signed   By: MInez CatalinaM.D.   On: 11/27/2014 11:37    CBC  Recent Labs Lab 11/26/14 1052  11/28/14 0830 11/28/14 1705 11/29/14 0345 11/30/14 0350 12/01/14 0543  WBC 19.8*  < > 10.8* 13.0* 11.4* 13.3* 10.1  HGB 7.6*  < > 7.2* 10.5* 10.7* 10.3* 9.4*  HCT 22.0*  < > 21.0* 30.2* 31.2* 30.0* 27.2*  PLT 522*  < > 400 353 381 430* 409*  MCV 78.9  < > 81.1 82.1 82.3 83.1 82.4  MCH 27.2  < > 27.8 28.5 28.2 28.5 28.5  MCHC 34.5  < > 34.3 34.8 34.3 34.3 34.6  RDW 12.8  < > 13.5 13.5 13.6 13.8 13.7  LYMPHSABS 1.4  --   --   --   --   --   --   MONOABS 1.8*  --   --   --   --   --   --   EOSABS 0.0  --   --   --   --   --   --   BASOSABS 0.0  --   --   --   --   --   --   < > = values in this interval not displayed.  Chemistries   Recent Labs Lab 11/26/14 1052 11/27/14 0402 11/28/14 0830 11/29/14 0345 11/29/14 0351 11/29/14 1605 11/30/14 0350 12/01/14 0543  NA 123* 130* 130* 133*  --   --  133* 133*  K 4.0 3.7 3.4* 2.7*  --  3.5 3.5 3.4*  CL 85* 95* 96 99  --   --  99 97  CO2 27 27 28 26   --   --  27 29  GLUCOSE 127* 101* 131* 150*  --   --  113* 138*  BUN 13 9 7  <5*  --   --  <5* 8  CREATININE 0.55 0.54 0.44* 0.43*  --   --  0.42* 0.36*  CALCIUM 7.7* 7.5* 7.2* 7.3*  --   --  7.7* 7.4*  MG  --   --   --   --  2.2  --   --   --    AST 15 14  --   --   --   --   --   --   ALT 32 24  --   --   --   --   --   --   ALKPHOS 52 47  --   --   --   --   --   --   BILITOT 0.6 0.5  --   --   --   --   --   --    ------------------------------------------------------------------------------------------------------------------ estimated creatinine clearance is 98.9 mL/min (by C-G formula based on Cr of 0.36). ------------------------------------------------------------------------------------------------------------------ No results for input(s): HGBA1C in the last 72 hours. ------------------------------------------------------------------------------------------------------------------ No results for input(s): CHOL, HDL, LDLCALC, TRIG, CHOLHDL, LDLDIRECT in the last 72 hours. ------------------------------------------------------------------------------------------------------------------ No results for input(s): TSH, T4TOTAL, T3FREE, THYROIDAB in the last 72 hours.  Invalid input(s): FREET3 ------------------------------------------------------------------------------------------------------------------ No results for input(s): VITAMINB12, FOLATE, FERRITIN, TIBC, IRON, RETICCTPCT in the last 72 hours.  Coagulation profile  Recent Labs Lab 11/27/14 0402  INR 1.26    No results for input(s): DDIMER in the last 72 hours.  Cardiac Enzymes No results for input(s): CKMB, TROPONINI, MYOGLOBIN in the last 168 hours.  Invalid input(s): CK ------------------------------------------------------------------------------------------------------------------ Invalid input(s): POCBNP  No results for input(s): GLUCAP in the last 72 hours.   Mekaila Tarnow M.D. [facility] 12/01/2014, 11:40 AM  Pager: [phone]   Between 7am to 7pm - call Pager - [phone]  After 7pm go to [email or web address] - password [email or web address]  Call night coverage person covering after 7pm

## 2014-12-01 NOTE — Progress Notes (Signed)
Occupational Therapy Treatment Patient Details Name: Nomar Broad MRN: [id number] DOB: Jan 08, 1996 Today's Date: 12/01/2014    History of present illness 19 yo male admitted with colitis. Multiple readmissions recently.    OT comments  Pt is making progress with OT:  Had one LOB.  Encouraged sitting for energy conservation and standing intermittently. Nauseous at end of session  Follow Up Recommendations  Supervision - Intermittent (likely no follow up OT)    Equipment Recommendations   (likely none)    Recommendations for Other Services      Precautions / Restrictions Precautions Precautions: Fall Restrictions Weight Bearing Restrictions: No       Mobility Bed Mobility Overal bed mobility: Modified Independent                Transfers Overall transfer level: Modified independent                    Balance           Standing balance support: During functional activity;Single extremity supported Standing balance-Leahy Scale: Fair                     ADL       Grooming: Oral care;Standing;Set up   Upper Body Bathing: Set up;Sitting   Lower Body Bathing: Sit to/from stand;Set up   Upper Body Dressing : Minimal assistance;Sitting (iv)       Toilet Transfer: Minimal assistance;Ambulation (IV pole)   Toileting- Clothing Manipulation and Hygiene: Modified independent;Sitting/lateral lean         General ADL Comments: Pt had one LOB when walking to bathroom with IV pole.  Stood with supervision to brush teeth and perform pericare.  Performed the rest of ADL sitting on 3:1 commode in front of sink.  Pt had nausea at end of session:  requested anti-nausea medication.  did not change socks--wanted to get back to bed and settle down.      Vision                     Perception     Praxis      Cognition   Behavior During Therapy: WFL for tasks assessed/performed Overall Cognitive Status: Within Functional Limits for tasks  assessed                       Extremity/Trunk Assessment               Exercises     Shoulder Instructions       General Comments      Pertinent Vitals/ Pain       Pain Assessment: 0-10 Pain Score: 3  Pain Location: abdomen Pain Descriptors / Indicators: Sore Pain Intervention(s): Monitored during session  Home Living Family/patient expects to be discharged to:: Private residence Living Arrangements: Other relatives Available Help at Discharge: Family               Bathroom Shower/Tub: Tub/shower unit Shower/tub characteristics: Curtain Biochemist, clinical: Standard                Prior Functioning/Environment Level of Independence: Independent            Frequency Min 2X/week     Progress Toward Goals  OT Goals(current goals can now be found in the care plan section)  Progress towards OT goals: Progressing toward goals     Plan      Co-evaluation  End of Session     Activity Tolerance  (limited by nausea)   Patient Left in bed;with call bell/phone within reach;with bed alarm set   Nurse Communication          Time: 5953-9672 OT Time Calculation (min): 42 min  Charges: OT General Charges $OT Visit: 1 Procedure OT Treatments $Self Care/Home Management : 23-37 mins  Winnifred Dufford 12/01/2014, 11:55 AM  Lesle Chris, OTR/L [phone] 12/01/2014

## 2014-12-02 MED ORDER — SIMETHICONE 80 MG PO CHEW: 80.0000 mg | CHEWABLE_TABLET | Freq: Four times a day (QID) | ORAL | Status: DC | PRN

## 2014-12-02 MED ORDER — MESALAMINE 1.2 G PO TBEC: 4.8000 g | DELAYED_RELEASE_TABLET | Freq: Every day | ORAL | Status: DC

## 2014-12-02 MED ORDER — SIMETHICONE 80 MG PO CHEW
80.0000 mg | CHEWABLE_TABLET | Freq: Four times a day (QID) | ORAL | Status: DC | PRN
Start: 2014-12-02 — End: 2014-12-02

## 2014-12-02 MED ORDER — OXYCODONE HCL 5 MG PO TABS: 5.0000 mg | ORAL_TABLET | Freq: Four times a day (QID) | ORAL | Status: DC | PRN

## 2014-12-02 MED ORDER — PREDNISONE 20 MG PO TABS: 40.0000 mg | ORAL_TABLET | Freq: Every day | ORAL | Status: DC

## 2014-12-02 MED ORDER — PREDNISONE 20 MG PO TABS
40.0000 mg | ORAL_TABLET | Freq: Every day | ORAL | Status: DC
  Administered 2014-12-02: 40 mg via ORAL
  Filled 2014-12-02 (×2): qty 2

## 2014-12-02 MED ORDER — CLONAZEPAM 0.5 MG PO TABS: 0.5000 mg | ORAL_TABLET | Freq: Every evening | ORAL | Status: DC | PRN

## 2014-12-02 MED ORDER — PREDNISONE 20 MG PO TABS: ORAL_TABLET | ORAL | Status: DC

## 2014-12-02 NOTE — Discharge Summary (Signed)
Physician Discharge Summary   Patient ID: Spencer Williams MRN: [id number] DOB/AGE: April 04, 1996 19 y.o.  Admit date: 11/26/2014 Discharge date: 12/02/2014  Primary Care Physician:  No PCP Per Patient  Discharge Diagnoses:    . Symptomatic anemia . ulcerative colitis  . Protein-calorie malnutrition, severe  Consults: Gastroenterology   Recommendations for Outpatient Follow-up:  Patient was reminded to follow-up with [facility], call on Monday for hospital follow-up appointment. Patient will need appointment with financial counselor, orange card and financial assistance.  The patient was also admitted to follow-up with Dr. Paulita Williams from gastroenterology.  Patient will be started on biological agents outpatient by gastroenterology.   DIET: Soft diet    Allergies:  No Known Allergies   Discharge Medications:   Medication List    STOP taking these medications        budesonide 3 MG 24 hr capsule  Commonly known as:  ENTOCORT EC     oxyCODONE-acetaminophen 5-325 MG per tablet  Commonly known as:  PERCOCET/ROXICET      TAKE these medications        clonazePAM 0.5 MG tablet  Commonly known as:  KLONOPIN  Take 1 tablet (0.5 mg total) by mouth at bedtime as needed for anxiety.     feeding supplement (RESOURCE BREEZE) Liqd  Take 1 Container by mouth 3 (three) times daily between meals.     mesalamine 1.2 G EC tablet  Commonly known as:  LIALDA  Take 4 tablets (4.8 g total) by mouth daily with breakfast.     ondansetron 4 MG disintegrating tablet  Commonly known as:  ZOFRAN ODT  Take 1 tablet (4 mg total) by mouth every 6 (six) hours as needed for nausea or vomiting.     oxyCODONE 5 MG immediate release tablet  Commonly known as:  Oxy IR/ROXICODONE  Take 1 tablet (5 mg total) by mouth every 6 (six) hours as needed for moderate pain.     predniSONE 20 MG tablet  Commonly known as:  DELTASONE  Take Prednisone 38m (2 tabs) with breakfast for 1 week,  then taper to 322m(1.5 tab) for next week, then stay on Prednisone 2081mab daily until further instructions from your GI doctor.     promethazine 25 MG tablet  Commonly known as:  PHENERGAN  Take 1 tablet (25 mg total) by mouth every 8 (eight) hours as needed for nausea or vomiting.     simethicone 80 MG chewable tablet  Commonly known as:  MYLICON  Chew 1 tablet (80 mg total) by mouth every 6 (six) hours as needed for flatulence.         Brief H and P: For complete details please refer to admission H and P, but in briefPatient is a [age]ar old male diagnosed with ulcerative colitis this month. Patient presented with frequent bloody diarrhea, severe abdominal pain, nausea, vomiting. Patient was admitted for further workup   Hospital Course:   Ulcerative Colitis flare: Acute recently diagnosed UC, Overall significant improvement, no nausea, vomiting or bloody diarrhea, patient is tolerating solid diet. He was admitted to stepdown unit and placed on mesalamine, hydrocortisone enema, IV Solu-Medrol. He has been transitioned to oral prednisone, to continue with the taper outpatient.  GI was consulted, patient was followed by Dr. outPaulita Fujitad planning to start anti-TNF alpha agents outpatient. Patient has been tolerating now oral pain medications over 24 hours. Case management consult was obtained regarding Medicaid legibility for the patient he will benefit [place]. He  will need appointment with GI. Patient was explained in great detail for the appointments with the [facility] and gastroenterology.Discussed with Dr. Amedeo Williams prior to the discharge, recommended prednisone 40 mg daily for a week, taper to 30 mg for a week then continue 20 mg daily until further instructions outpatient by Dr. Paulita Williams. - Hepatitis panel negative   Protein-calorie malnutrition, severe - Continue nutritional supplements   Symptomatic anemia - Hemoglobin stable,  status post 2 units packed RBC transfusion for hemoglobin 7.2 on 3/22. Hemoglobin 9.4 at the time of discharge   Generalized weakness - PTOT consult was placed and recommended intermittent supervision  Anxiety issues Patient had significant improvement in his mental status and anxiety after 1 dose of Ativan. He was given low-dose Klonopin at bedtime as needed prescription (#30, no refills.)  Social issues Patient is very demotivated and disengaging in conversation with somewhat flat affect. After significant effort and questioning, I was able to understand his social situation somewhat. Patient reported that he is actually [place] citizen (his father is [place] citizen) but he was living in [place] and [place], came to [place] 8 months ago. 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 He lives at home with his younger brother who is 20 years old. Patient reported that he does not have a driver's license yet and nor his brother. Patient reported that his father and the rest of the family lives in [place]. Patient received assistance from case management and I also explained him in detail about Medicaid, community wellness [facility]. Case management also helped him with the Match program for his prescriptions.  Day of Discharge BP 95/48 mmHg  Pulse 66  Temp(Src) 98.9 F (37.2 C) (Oral)  Resp 16  Ht 5' 7"  (1.702 m)  Wt 47.1 kg (103 lb 13.4 oz)  BMI 16.26 kg/m2  SpO2 100%  Physical Exam: General: Alert and awake oriented x3 not in any acute distress. HEENT: anicteric sclera, pupils reactive to light and accommodation CVS: S1-S2 clear no murmur rubs or gallops Chest: clear to auscultation bilaterally, no wheezing rales or rhonchi Abdomen: soft nontender, nondistended, normal bowel sounds Extremities: no cyanosis, clubbing or edema noted bilaterally Neuro: Cranial nerves II-XII intact, no focal neurological deficits   The results of significant  diagnostics from this hospitalization (including imaging, microbiology, ancillary and laboratory) are listed below for reference.    LAB RESULTS: Basic Metabolic Panel:  Recent Labs Lab 11/29/14 0351  11/30/14 0350 12/01/14 0543  NA  --   --  133* 133*  K  --   < > 3.5 3.4*  CL  --   --  99 97  CO2  --   --  27 29  GLUCOSE  --   --  113* 138*  BUN  --   --  <5* 8  CREATININE  --   --  0.42* 0.36*  CALCIUM  --   --  7.7* 7.4*  MG 2.2  --   --   --   < > = values in this interval not displayed. Liver Function Tests:  Recent Labs Lab 11/26/14 1052 11/27/14 0402  AST 15 14  ALT 32 24  ALKPHOS 52 47  BILITOT 0.6 0.5  PROT 5.6* 5.1*  ALBUMIN 1.9* 1.8*   No results for input(s): LIPASE, AMYLASE in the last 168 hours. No results for input(s): AMMONIA in the last 168 hours. CBC:  Recent Labs Lab 11/26/14  1052  11/30/14 0350 12/01/14 0543  WBC 19.8*  < > 13.3* 10.1  NEUTROABS 16.6*  --   --   --   HGB 7.6*  < > 10.3* 9.4*  HCT 22.0*  < > 30.0* 27.2*  MCV 78.9  < > 83.1 82.4  PLT 522*  < > 430* 409*  < > = values in this interval not displayed. Cardiac Enzymes: No results for input(s): CKTOTAL, CKMB, CKMBINDEX, TROPONINI in the last 168 hours. BNP: Invalid input(s): POCBNP CBG: No results for input(s): GLUCAP in the last 168 hours.  Significant Diagnostic Studies:  Dg Abd 2 Views  11/27/2014   CLINICAL DATA:  Abdominal pain for 3 days, history of ulcerative colitis  EXAM: ABDOMEN - 2 VIEW  COMPARISON:  11/07/2014  FINDINGS: Scattered large and small bowel gas is noted. Fecal material is noted within the colon. No obstructive changes are seen. No free air is noted. No bony abnormality is seen.  IMPRESSION: Nonspecific abdomen.   Electronically Signed   By: Inez Catalina M.D.   On: 11/27/2014 11:37    2D ECHO:   Disposition and Follow-up:     Discharge Instructions    Diet - low sodium heart healthy    Complete by:  As directed      Discharge instructions     Complete by:  As directed   [facility] for follow-up appt. They have same day appointment as well for hospital follow-ups. Please request for financial counseler to arrange for orange card and financial assistance.     You will need to call GI office (Dr Spencer Williams) to be seen in 2 weeks for hospital follow-up.  Return back to ER if worsening abdominal pain, diarrhea, fevers above 101, vomiting.  DIET: SOFT DIET only     Increase activity slowly    Complete by:  As directed             DISPOSITION: home   DISCHARGE FOLLOW-UP Follow-up Information    Follow up with [name], MD. Schedule an appointment as soon as possible for a visit in 2 weeks.   Specialty:  Gastroenterology   Why:  for hospital follow-up   Contact information:   [address] [place] [address] [phone]       Follow up with [facility]     In 1 week.   Why:  Please [facility] on Monday, 3/28 for follow-up appt. They have same day appointment as well for hospital follow-ups. Please request for financial counseler to arrange for orange card and financial assistance.      Contact information:   [address] [place] [address] [phone]       Time spent on Discharge: 38 mins  Signed:   Jahnia Hewes M.D. [facility] 12/02/2014, 12:26 PM Pager: [phone]

## 2014-12-02 NOTE — Progress Notes (Signed)
Pt left at this time via [employer] taxi service; headed home. Pt alert and oriented. Followup appointments noted to be made Monday. Expressed need for pt to sign-up for Medicaid online ASAP. Pt to get meds from [facility] or "path" at [place].

## 2014-12-02 NOTE — Care Management Note (Signed)
CARE MANAGEMENT NOTE 12/02/2014  Patient:  Spencer Williams, Spencer Williams   Account Number:  [id number]  Date Initiated:  11/27/2014  Documentation initiated by:  DAVIS,RHONDA  Subjective/Objective Assessment:   newly diagnosed ulcerative pancolitis, colonoscopically and histologically confirmed     Action/Plan:   home when stable   Anticipated DC Date:  12/01/2014   Anticipated DC Plan:  HOME/SELF CARE  In-house referral  Development worker, community  PCP / [place]  CM consult  Medication [facility]      [facility] Choice  NA   Choice offered to / List presented to:  NA           Status of service:  Completed, signed off Medicare Important Message given?   (If response is "NO", the following Medicare IM given date fields will be blank) Date Medicare IM given:   Medicare IM given by:   Date Additional Medicare IM given:   Additional Medicare IM given by:    Discharge Disposition:    Per UR Regulation:  Reviewed for med. necessity/level of care/duration of stay  If discussed at [place] of Stay Meetings, dates discussed:    Comments:  12/02/2014  1330  CM spoke with patient in his room and he requested assistance with his discharged medications. [place] letter given to patient to cover his medications except Oxycodone. patient says he has another pain medication that is non-narcotic.  CM provided the [facility] information as was given previously on 11/02/2014 by CM Seth Bake for assistance with PCP.  CM educates patient to call Medicaid or contact via online to process application, collaborates with patient's Nurse - Abigail Butts to include in d/c instruction as well.  Patient confirmed he is able to get rides to his appointments, says he will ask some friends around for assistance.  Says he is able to pay the $3 co-pay for each medication at this time.  Patient confirmed he will re-enrol in school next seemester at his regular sch - [employer] when he get used to his new  diagnosis. Says he is okay at this time.  Marylyn Ishihara, RN, BSN, CCM  11/30/14 Edwyna Shell RN BSn CM [phone] Patient stated that he lives at home with his younger brother and they do not drive but use taxi's for transportation. He stated that he has been in the country 8 months and is from [place]. He states that he is a [place] citizen. Provided the patient with a [facility] pamphlet and encouraged him to make a follow up appointment with doctor and with financial counselor for resources. Assisted patient will filling out the patient assist app from GI. 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  [id number] Davis,RN,BSn,CCM/ pt moved out of step down to acute care floor. [id number]/XBMWUX Davis,RN,BSN,CCM: Information for free medication assistance and how to obtain pcp through the [facility] outpt clinic given and reviewed with patient.  November 27, 2014/Rhonda L. Rosana Hoes, RN, BSN, CCM. Case Management [place] [phone] No discharge needs present of time of review.

## 2014-12-02 NOTE — Discharge Instructions (Signed)

## 2014-12-03 LAB — QUANTIFERON TB GOLD ASSAY (BLOOD)

## 2014-12-03 LAB — QUANTIFERON IN TUBE
QFT TB AG MINUS NIL VALUE: 0.25 IU/mL
QUANTIFERON MITOGEN VALUE: 0.04 IU/mL
QUANTIFERON NIL VALUE: 0.04 [IU]/mL
QUANTIFERON TB AG VALUE: 0.29 [IU]/mL
QUANTIFERON TB GOLD: UNDETERMINED

## 2014-12-03 NOTE — Clinical Social Work Note (Signed)
CSW received a call from RN requesting a taxi voucher for pt's discharge home  CSW met with pt to confirm address and provided RN with voucher  .Dede Query, LCSW [facility] Clinical Social Worker - Weekend Coverage cell #: [phone]

## 2014-12-08 ENCOUNTER — Ambulatory Visit: Payer: Self-pay | Attending: Family Medicine | Admitting: Family Medicine

## 2014-12-08 ENCOUNTER — Inpatient Hospital Stay: Payer: Self-pay | Admitting: Family Medicine

## 2014-12-08 ENCOUNTER — Encounter: Payer: Self-pay | Admitting: Family Medicine

## 2014-12-08 VITALS — BP 99/65 | HR 100 | Temp 98.2°F | Resp 18 | Ht 67.0 in | Wt 103.6 lb

## 2014-12-08 DIAGNOSIS — E43 Unspecified severe protein-calorie malnutrition: Secondary | ICD-10-CM

## 2014-12-08 DIAGNOSIS — F329 Major depressive disorder, single episode, unspecified: Secondary | ICD-10-CM | POA: Insufficient documentation

## 2014-12-08 DIAGNOSIS — K51918 Ulcerative colitis, unspecified with other complication: Secondary | ICD-10-CM

## 2014-12-08 DIAGNOSIS — F320 Major depressive disorder, single episode, mild: Secondary | ICD-10-CM

## 2014-12-08 DIAGNOSIS — K5190 Ulcerative colitis, unspecified, without complications: Secondary | ICD-10-CM | POA: Insufficient documentation

## 2014-12-08 DIAGNOSIS — D649 Anemia, unspecified: Secondary | ICD-10-CM

## 2014-12-08 LAB — CBC WITH DIFFERENTIAL/PLATELET
Basophils Absolute: 0 10*3/uL (ref 0.0–0.1)
Basophils Relative: 0 % (ref 0–1)
EOS ABS: 0 10*3/uL (ref 0.0–0.7)
EOS PCT: 0 % (ref 0–5)
HCT: 30.6 % — ABNORMAL LOW (ref 39.0–52.0)
HEMOGLOBIN: 10.4 g/dL — AB (ref 13.0–17.0)
LYMPHS PCT: 10 % — AB (ref 12–46)
Lymphs Abs: 1 10*3/uL (ref 0.7–4.0)
MCH: 27.7 pg (ref 26.0–34.0)
MCHC: 34 g/dL (ref 30.0–36.0)
MCV: 81.4 fL (ref 78.0–100.0)
MONO ABS: 0.4 10*3/uL (ref 0.1–1.0)
MPV: 8 fL — AB (ref 8.6–12.4)
Monocytes Relative: 4 % (ref 3–12)
NEUTROS PCT: 86 % — AB (ref 43–77)
Neutro Abs: 8.7 10*3/uL — ABNORMAL HIGH (ref 1.7–7.7)
Platelets: 408 10*3/uL — ABNORMAL HIGH (ref 150–400)
RBC: 3.76 MIL/uL — ABNORMAL LOW (ref 4.22–5.81)
RDW: 14.7 % (ref 11.5–15.5)
WBC: 10.1 10*3/uL (ref 4.0–10.5)

## 2014-12-08 LAB — BASIC METABOLIC PANEL
BUN: 10 mg/dL (ref 6–23)
CO2: 28 mEq/L (ref 19–32)
CREATININE: 0.47 mg/dL — AB (ref 0.50–1.35)
Calcium: 7.6 mg/dL — ABNORMAL LOW (ref 8.4–10.5)
Chloride: 95 mEq/L — ABNORMAL LOW (ref 96–112)
Glucose, Bld: 100 mg/dL — ABNORMAL HIGH (ref 70–99)
Potassium: 3.7 mEq/L (ref 3.5–5.3)
Sodium: 133 mEq/L — ABNORMAL LOW (ref 135–145)

## 2014-12-08 MED ORDER — FLUOXETINE HCL 20 MG PO TABS: 20.0000 mg | ORAL_TABLET | Freq: Every day | ORAL | Status: DC

## 2014-12-08 MED ORDER — MESALAMINE 1.2 G PO TBEC: 4.8000 g | DELAYED_RELEASE_TABLET | Freq: Every day | ORAL | Status: DC

## 2014-12-08 NOTE — Patient Instructions (Signed)
Ulcerative Colitis Ulcerative colitis is a long lasting swelling and soreness (inflammation) of the colon (large intestine). In patients with ulcerative colitis, sores (ulcers) and inflammation of the inner lining of the colon lead to illness. Ulcerative colitis can also cause problems outside the digestive tract.  Ulcerative colitis is closely related to another condition of inflammation of the intestines called Crohn's disease. Together, they are frequently referred to as inflammatory bowel disease (IBD). Ulcerative colitis and Crohn's diseases are conditions that can last years to decades. Men and women are affected equally. They most commonly begin during adolescence and early adulthood. SYMPTOMS  Common symptoms of ulcerative colitis include rectal bleeding and diarrhea. There is a wide range of symptoms among patients with this disease depending on how severe the disease is. Some of these symptoms are:  Abdominal pain or cramping.  Diarrhea.  Fever.  Tiredness (fatigue).  Weight loss.  Night sweats.  Rectal pain.  Feeling the immediate need to have a bowel movement (rectal urgency). CAUSES  Ulcerative colitis is caused by increased activity of the immune system in the intestines. The immune system is the system that protects the body against disease such as harmful bacteria, viruses, fungi, and other foreign invaders. When the immune system overacts, it causes inflammation. The cause of the increased immune system activity is not known. This over activity causes long-lasting inflammation and ulceration. This condition may be passed down from your parents (inherited). Brothers, sisters, children, and parents of patients with IBD are more likely to develop these diseases. It is not contagious. This means you cannot catch it from someone else. DIAGNOSIS  Your caregiver may suspect ulcerative colitis based on your symptoms and exam. Blood tests may confirm that there is a problem. You may  be asked to submit a stool specimen for examination. X-rays and CT scans may be necessary. Ultimately, the diagnosis is usually made after a flexible tube is inserted via your anus and your colon is examined under sedation (colonoscopy). With this test, the specialist can take a tiny tissue sample from inside the bowel (biopsy). Examination of this biopsy tissue under a microscopy can reveal ulcerative colitis as the cause of your symptoms. TREATMENT   There is no cure for ulcerative colitis.  Complications such as massive bleeding from the colon (hemorrhage), development of a hole in the colon (perforation), or the development of precancerous or cancerous changes of the colon may require surgery.  Medications are often used to decrease inflammation and control the immune system. These include medicines related to aspirin, steroid medications, and newer and stronger medications to slow down the immune system. Some medications may be used as suppositories or enemas. A number of other medications are used or have been studied. Your caregiver will make specific recommendations. HOME CARE INSTRUCTIONS   There is no cure for ulcerative colitis disease. The best treatment is frequent checkups with your caregiver. Periodic reevaluation is important.  Symptoms such as diarrhea can be controlled with medications. Avoid foods that have a laxative effect such fresh fruit and vegetables and dairy products. During flare ups, you can rest your bowel by staying away from solid foods. Drink clear liquids frequently during the day. Electrolyte or rehydrating fluids are best. Your caregiver can help you with suggestions. Drink often to prevent dehydration. When diarrhea has cleared, eat smaller meals and more often. Avoid food additives and stimulants such as caffeine (coffee, tea, many sodas, or chocolate). Avoid dairy products. Enzyme supplements may help if you develop intolerance to  a sugar in dairy products  (lactose). Ask your caregiver or dietitian about specific dietary instructions.  If you had surgery, be sure you understand your care instructions thoroughly, including proper care of any surgical wounds.  Take any medications exactly as prescribed.  Try to maintain a positive attitude. Learn relaxation techniques such as self hypnosis, mental imaging, and muscle relaxation. If possible, avoid stresses that aggravate your condition. Exercise regularly. Follow your diet. Always get plenty of rest. SEEK MEDICAL CARE IF:   Your symptoms fail to improve after a week or two of new treatment.  You experience continued weight loss.  You have ongoing crampy digestion or loose bowels.  You develop a new skin rash, skin sores, or eye problems. SEEK IMMEDIATE MEDICAL CARE IF:   You have worsening of your symptoms or develop new symptoms.  You have an oral temperature above 102 F (38.9 C), not controlled by medicine.  You develop bloody diarrhea.  You have severe abdominal pain. Document Released: 06/04/2005 Document Revised: 11/17/2011 Document Reviewed: 05/04/2007 [facility] Patient Information 2015 [place], [place]. This information is not intended to replace advice given to you by your health care provider. Make sure you discuss any questions you have with your health care provider.

## 2014-12-08 NOTE — Progress Notes (Signed)
Subjective:    Patient ID: Spencer Williams, male    DOB: October 20, 1995, 19 y.o.   MRN: [id number]  HPI  19 year old male patient who presents to establish care as a hospital follow-up being admitted at [facility] between 63/20/2016-12/02/2014 for ulcerative colitis after he had presented with abdominal pain and bloody diarrhea. He was admitted to stepdown and placed on IV Solu-Medrol, morphine. He had a hematocrit of 23.4 and so received  packed RBCs with improvement in his hematocrit to 30.2 and it was at 27.2 at discharge. He was seen by GI consult in house. He had a hepatitis panel and still pathogen panel which came back negative. Of note he did have a colonoscopy on 11/08/2014 which revealed moderate colitis throughout the colon as well as internal and external hemorrhoids; an ileum and colon was done and findings were consistent with inflammatory bowel disease and most compatible with ulcerative colitis; malignancy was excluded.    Interval History: Still has Diarrhea and moved his bowel 7 times yesterday but it had no blood in it. He endorses compliance with his medications. He admits to being sad as he is here with his younger brother and his family lives in [place]. He had to quit school and his job due to his ulcerative colitis flaring up. He denies suicidal ideations or intentions. His appetite has improved, and he is avoiding foods that trigger his abdominal pain.  Past Medical History  Diagnosis Date  . Ulcerative colitis      No Known Allergies   Current Outpatient Prescriptions on File Prior to Visit  Medication Sig Dispense Refill  . clonazePAM (KLONOPIN) 0.5 MG tablet Take 1 tablet (0.5 mg total) by mouth at bedtime as needed for anxiety. 30 tablet 0  . feeding supplement, RESOURCE BREEZE, (RESOURCE BREEZE) LIQD Take 1 Container by mouth 3 (three) times daily between meals. 237 mL 0  . oxyCODONE (OXY IR/ROXICODONE) 5 MG immediate release tablet Take 1 tablet (5 mg  total) by mouth every 6 (six) hours as needed for moderate pain. 30 tablet 0  . predniSONE (DELTASONE) 20 MG tablet Take Prednisone 6m (2 tabs) with breakfast for 1 week, then taper to 361m(1.5 tab) for next week, then stay on Prednisone 2054mab daily until further instructions from your GI doctor. 60 tablet 1  . promethazine (PHENERGAN) 25 MG tablet Take 1 tablet (25 mg total) by mouth every 8 (eight) hours as needed for nausea or vomiting. 15 tablet 0  . simethicone (MYLICON) 80 MG chewable tablet Chew 1 tablet (80 mg total) by mouth every 6 (six) hours as needed for flatulence. 60 tablet 0  . ondansetron (ZOFRAN ODT) 4 MG disintegrating tablet Take 1 tablet (4 mg total) by mouth every 6 (six) hours as needed for nausea or vomiting. (Patient not taking: Reported on 12/08/2014) 10 tablet 0   No current facility-administered medications on file prior to visit.      Review of Systems  Constitutional: Negative for activity change and appetite change.  HENT: Negative for sinus pressure and sore throat.   Eyes: Negative for visual disturbance.  Respiratory: Negative for chest tightness and shortness of breath.   Cardiovascular: Negative for chest pain and palpitations.  Gastrointestinal: Positive for diarrhea. Negative for abdominal pain and abdominal distention.       See HPI  Endocrine: Negative for cold intolerance, heat intolerance and polyphagia.  Genitourinary: Negative for dysuria, frequency and difficulty urinating.  Musculoskeletal: Negative for back pain, joint swelling and  arthralgias.  Skin: Negative for color change.  Neurological: Negative for dizziness, tremors and weakness.  Psychiatric/Behavioral: Negative for suicidal ideas and behavioral problems.       Objective:   Filed Vitals:   12/08/14 1123  BP: 99/65  Pulse: 100  Temp: 98.2 F (36.8 C)  Resp: 18      Physical Exam  Constitutional: He is oriented to person, place, and time. He appears well-developed and  well-nourished.  HENT:  Head: Normocephalic and atraumatic.  Right Ear: External ear normal.  Left Ear: External ear normal.  Eyes: Conjunctivae and EOM are normal. Pupils are equal, round, and reactive to light.  Neck: Normal range of motion. Neck supple. No tracheal deviation present.  Cardiovascular: Normal rate, regular rhythm and normal heart sounds.   No murmur heard. Pulmonary/Chest: Effort normal and breath sounds normal. No respiratory distress. He has no wheezes. He exhibits no tenderness.  Abdominal: Soft. Bowel sounds are normal. He exhibits no mass. There is no tenderness.  Musculoskeletal: Normal range of motion. He exhibits no edema or tenderness.  Neurological: He is alert and oriented to person, place, and time.  Skin: Skin is warm and dry.  Psychiatric: He has a normal mood and affect.       Chemistry      Component Value Date/Time   NA 133* 12/01/2014 0543   K 3.4* 12/01/2014 0543   CL 97 12/01/2014 0543   CO2 29 12/01/2014 0543   BUN 8 12/01/2014 0543   CREATININE 0.36* 12/01/2014 0543      Component Value Date/Time   CALCIUM 7.4* 12/01/2014 0543   ALKPHOS 47 11/27/2014 0402   AST 14 11/27/2014 0402   ALT 24 11/27/2014 0402   BILITOT 0.5 11/27/2014 0402     Lab Results  Component Value Date   WBC 10.1 12/01/2014   HGB 9.4* 12/01/2014   HCT 27.2* 12/01/2014   MCV 82.4 12/01/2014   PLT 409* 12/01/2014        Assessment & Plan:  19 year old male patient with a history of ulcerative colitis currently on medication also continues to have diarrhea but denies the presence of blood in it.  Ulcerative colitis: Acute flare currently improving. Advised to stay hydrated, continue with medications and dietary modifications advised. I have referred him to a GI specialist and have gotten the social worker to speak with him regarding options available for Medicaid and [place] card as this will be a challenge.  Anemia: I am repeating a CBC to evaluate for any  ongoing occult blood losses.  Depression: Chronic medical problems as well as his social situation I risk factors for that condition and he is willing to commence an antidepressant and so I will be placing him on Prozac. Behavioral health to see him for psychotherapy. He was discharged on Klonopin which I have explained to him is habit forming and I would love him to taper himself off of it I have given instructions as to how to proceed with that.  Side effects of medications discussed and all questions answered and patient is agreeable with plan.

## 2014-12-08 NOTE — Progress Notes (Signed)
Patient here for hospital follow up of colitis Patient reports lower abdominal pain at level 2 described as pressure. Patient reports multiple diarrhea episodes, none with blood.

## 2014-12-11 ENCOUNTER — Telehealth: Payer: Self-pay

## 2014-12-12 NOTE — Telephone Encounter (Signed)
Left message

## 2014-12-22 ENCOUNTER — Telehealth: Payer: Self-pay | Admitting: Clinical

## 2014-12-22 NOTE — Telephone Encounter (Signed)
F/U call, pt uncertain which pharmacy to pick up prescriptions, found that some were prescribed at hospital/ Pt will pick up fluoxetine @[facility] on 12/25/14, and will inquire at hospital about others.

## 2014-12-29 ENCOUNTER — Encounter: Payer: Self-pay | Admitting: Internal Medicine

## 2014-12-29 ENCOUNTER — Ambulatory Visit: Payer: Medicaid Other | Attending: Internal Medicine | Admitting: Internal Medicine

## 2014-12-29 VITALS — BP 116/73 | HR 103 | Temp 97.8°F | Resp 18 | Ht 67.0 in | Wt 112.4 lb

## 2014-12-29 DIAGNOSIS — K51918 Ulcerative colitis, unspecified with other complication: Secondary | ICD-10-CM | POA: Insufficient documentation

## 2014-12-29 DIAGNOSIS — R110 Nausea: Secondary | ICD-10-CM | POA: Diagnosis not present

## 2014-12-29 DIAGNOSIS — D509 Iron deficiency anemia, unspecified: Secondary | ICD-10-CM | POA: Insufficient documentation

## 2014-12-29 MED ORDER — ONDANSETRON 4 MG PO TBDP: 4.0000 mg | ORAL_TABLET | Freq: Four times a day (QID) | ORAL | Status: DC | PRN

## 2014-12-29 MED ORDER — MESALAMINE 1.2 G PO TBEC: 4.8000 g | DELAYED_RELEASE_TABLET | Freq: Every day | ORAL | Status: DC

## 2014-12-29 MED ORDER — PREDNISONE 20 MG PO TABS: 20.0000 mg | ORAL_TABLET | Freq: Every day | ORAL | Status: DC

## 2014-12-29 MED ORDER — FERROUS SULFATE 325 (65 FE) MG PO TABS: 325.0000 mg | ORAL_TABLET | Freq: Three times a day (TID) | ORAL | Status: AC

## 2014-12-29 NOTE — Progress Notes (Signed)
Patient here for follow-up of ulcerative colitis. Dx 3/16.  Hospitalized 11/26/14-12/02/14 for ulcerative colitis.  Patient indicates he is avoiding greasy, spicy foods. Per patient, pain worse on "cold days".  He indicates on these days he has lower abdominal pain and frequent trips to bathroom. Pain currently 5/10.   Denies blood in stools. Out of oxycodone per patient.

## 2014-12-29 NOTE — Progress Notes (Signed)
MRN: [id number] Name: Spencer Williams  Sex: male Age: 19 y.o. DOB: 04/09/1996  Allergies: Review of patient's allergies indicates no known allergies.  Chief Complaint  Patient presents with  . Ulcerative Colitis    HPI: Patient is 19 y.o. male who history of ulcerative colitis, last month he was hospitalized and subsequently followed up with positional care clinic Dr. Jarold Song please requesting refill on this medication, as per patient 2 weeks ago he has seen gastroenterologist and has another followup  next week, as per patient there planning to switch his medication but at this point is not sure about prednisone and its refill, she had a blood work done 3 weeks ago which was reviewed with the patient noticed improvement in anemia and his potassium level was normal still sometimes he has 2-3 bowel movements denies any more bleeding, takes oxycodone when necessary, patient still has medication.  Past Medical History  Diagnosis Date  . Ulcerative colitis     Past Surgical History  Procedure Laterality Date  . Flexible sigmoidoscopy N/A 11/08/2014    Procedure: FLEXIBLE SIGMOIDOSCOPY;  Surgeon: Jeryl Columbia, MD;  Location: [facility];  Service: Endoscopy;  Laterality: N/A;      Medication List       This list is accurate as of: 12/29/14 12:09 PM.  Always use your most recent med list.               clonazePAM 0.5 MG tablet  Commonly known as:  KLONOPIN  Take 1 tablet (0.5 mg total) by mouth at bedtime as needed for anxiety.     feeding supplement (RESOURCE BREEZE) Liqd  Take 1 Container by mouth 3 (three) times daily between meals.     ferrous sulfate 325 (65 FE) MG tablet  Commonly known as:  FERROUSUL  Take 1 tablet (325 mg total) by mouth 3 (three) times daily with meals.     FLUoxetine 20 MG tablet  Commonly known as:  PROZAC  Take 1 tablet (20 mg total) by mouth daily.     mesalamine 1.2 G EC tablet  Commonly known as:  LIALDA  Take 4 tablets (4.8 g total) by  mouth daily with breakfast.     ondansetron 4 MG disintegrating tablet  Commonly known as:  ZOFRAN ODT  Take 1 tablet (4 mg total) by mouth every 6 (six) hours as needed for nausea or vomiting.     oxyCODONE 5 MG immediate release tablet  Commonly known as:  Oxy IR/ROXICODONE  Take 1 tablet (5 mg total) by mouth every 6 (six) hours as needed for moderate pain.     predniSONE 20 MG tablet  Commonly known as:  DELTASONE  Take 1 tablet (20 mg total) by mouth daily. Prednisone 18m tab daily until further instructions from your GI doctor.     promethazine 25 MG tablet  Commonly known as:  PHENERGAN  Take 1 tablet (25 mg total) by mouth every 8 (eight) hours as needed for nausea or vomiting.     simethicone 80 MG chewable tablet  Commonly known as:  MYLICON  Chew 1 tablet (80 mg total) by mouth every 6 (six) hours as needed for flatulence.        Meds ordered this encounter  Medications  . mesalamine (LIALDA) 1.2 G EC tablet    Sig: Take 4 tablets (4.8 g total) by mouth daily with breakfast.    Dispense:  30 tablet    Refill:  2  . ondansetron (ZOFRAN  ODT) 4 MG disintegrating tablet    Sig: Take 1 tablet (4 mg total) by mouth every 6 (six) hours as needed for nausea or vomiting.    Dispense:  10 tablet    Refill:  0  . predniSONE (DELTASONE) 20 MG tablet    Sig: Take 1 tablet (20 mg total) by mouth daily. Prednisone 59m tab daily until further instructions from your GI doctor.    Dispense:  30 tablet    Refill:  1  . ferrous sulfate (FERROUSUL) 325 (65 FE) MG tablet    Sig: Take 1 tablet (325 mg total) by mouth 3 (three) times daily with meals.    Dispense:  90 tablet    Refill:  3    Immunization History  Administered Date(s) Administered  . Influenza,inj,Quad PF,36+ Mos 11/11/2014    Family History  Problem Relation Age of Onset  . Colitis Mother   . Colitis Father     History  Substance Use Topics  . Smoking status: Never Smoker   . Smokeless tobacco: Never  Used  . Alcohol Use: No    Review of Systems   As noted in HPI  Filed Vitals:   12/29/14 1109  BP: 116/73  Pulse: 103  Temp: 97.8 F (36.6 C)  Resp: 18    Physical Exam  Physical Exam  Constitutional: No distress.  Eyes: EOM are normal. Pupils are equal, round, and reactive to light.  Cardiovascular: Normal rate and regular rhythm.   Pulmonary/Chest: Breath sounds normal. No respiratory distress. He has no wheezes. He has no rales.  Abdominal: Soft. There is no rebound.  Minimal tenderness in the left lower quadrant, no rebound or guarding, bowel sounds positive  Musculoskeletal: He exhibits no edema.    CBC    Component Value Date/Time   WBC 10.1 12/08/2014 1211   RBC 3.76* 12/08/2014 1211   HGB 10.4* 12/08/2014 1211   HCT 30.6* 12/08/2014 1211   PLT 408* 12/08/2014 1211   MCV 81.4 12/08/2014 1211   LYMPHSABS 1.0 12/08/2014 1211   MONOABS 0.4 12/08/2014 1211   EOSABS 0.0 12/08/2014 1211   BASOSABS 0.0 12/08/2014 1211    CMP     Component Value Date/Time   NA 133* 12/08/2014 1211   K 3.7 12/08/2014 1211   CL 95* 12/08/2014 1211   CO2 28 12/08/2014 1211   GLUCOSE 100* 12/08/2014 1211   BUN 10 12/08/2014 1211   CREATININE 0.47* 12/08/2014 1211   CREATININE 0.36* 12/01/2014 0543   CALCIUM 7.6* 12/08/2014 1211   PROT 5.1* 11/27/2014 0402   ALBUMIN 1.8* 11/27/2014 0402   AST 14 11/27/2014 0402   ALT 24 11/27/2014 0402   ALKPHOS 47 11/27/2014 0402   BILITOT 0.5 11/27/2014 0402   GFRNONAA >90 12/01/2014 0543   GFRAA >90 12/01/2014 0543    No results found for: CHOL  No results found for: HGBA1C  Lab Results  Component Value Date/Time   AST 14 11/27/2014 04:02 AM    Assessment and Plan  Ulcerative colitis, other complication - Plan: mesalamine (LIALDA) 1.2 G EC tablet, predniSONE (DELTASONE) 20 MG tablet, patient followup with his gastroenterologist.  Anemia, iron deficiency - Plan: I have started patient on ferrous sulfate (FERROUSUL) 325 (65  FE) MG tablet  Nausea - Plan: ondansetron (ZOFRAN ODT) 4 MG disintegrating tablet, when necessary   Return in about 3 months (around 03/30/2015), or if symptoms worsen or fail to improve.   This note has been created with Dragon speech recognition  Engineer, drilling. Any transcriptional errors are unintentional.    Lorayne Marek, MD

## 2015-03-06 ENCOUNTER — Telehealth: Payer: Self-pay | Admitting: Internal Medicine

## 2015-03-06 NOTE — Telephone Encounter (Signed)
Patient has come in today to schedule an appointment; patient was instructed to come in on Thursday morning @ 11:00am with [facility] provider;

## 2015-03-08 ENCOUNTER — Ambulatory Visit: Payer: Medicaid Other | Admitting: Family Medicine

## 2015-03-08 ENCOUNTER — Ambulatory Visit: Payer: Medicaid Other | Attending: Physician Assistant | Admitting: Physician Assistant

## 2015-03-08 VITALS — BP 104/69 | HR 65 | Temp 97.7°F | Resp 18 | Ht 67.0 in | Wt 129.6 lb

## 2015-03-08 DIAGNOSIS — L659 Nonscarring hair loss, unspecified: Secondary | ICD-10-CM | POA: Insufficient documentation

## 2015-03-08 DIAGNOSIS — R5383 Other fatigue: Secondary | ICD-10-CM | POA: Insufficient documentation

## 2015-03-08 LAB — TSH: TSH: 1.932 u[IU]/mL (ref 0.350–4.500)

## 2015-03-08 NOTE — Progress Notes (Signed)
Patient here today stating that 2-3 months back ulcerative colitis.  As of last week, patient reports feeling really tired and under eyes is swollen/painful- as if "someone hit me, like a bruise."  Patient states he is constantly tired even after sleeping 10 hours.  Patient reports difficulty sleeping as well.  Patient also reports bad hair loss that he notices in the shower.  Patient states he has decreased appetite, and  sometimes only eats one meal per day.   Patient's main complaint is flaky/painful skin on face, hands, and sometimes back, as well as hair loss.  Cheek area is most painful when "shedding," described as burning.  Nothing seems to relieve the pain, according to the patient.   Patient takes Sympony (monthly injection medication for ulcerative colitis)

## 2015-03-08 NOTE — Progress Notes (Signed)
Chief Complaint: Hair loss; dry, flaky skin  Subjective: This is a 19 year old male with a history of ulcerative colitis treated by his gastroenterologist who is presenting with fatigue. He states that for the last couple of weeks his been having increasing fatigue recently performed approximately 10 hours and still not feeling rested. He is also experienced some hair loss all over. Especially his scalp. His skin has become more dry and flaky in the face and in the scalp. He's had some decrease in his weight. He's had a loss of appetite. He is only eating possibly one meal per day despite being on steroids.   ROS:  GEN: denies fever or chills, denies change in weight Skin: denies lesions or rashes; admits to flakiness and dryness, no peeling no recent excessive sun exposure HEENT: denies headache, earache, epistaxis, sore throat, or neck pain CV: denies CP or palpitations ABD: denies abd pain, N or V   Objective:  Filed Vitals:   03/08/15 1116  BP: 104/69  Pulse: 65  Temp: 97.7 F (36.5 C)  TempSrc: Oral  Resp: 18  Height: 5' 7"  (1.702 m)  Weight: 129 lb 9.6 oz (58.786 kg)  SpO2: 98%    Physical Exam:  General: in no acute distress. Skin-face forehead, cheeks dry and flaky HEENT: no pallor, no icterus, moist oral mucosa, no JVD, no lymphadenopathy Heart: Normal  s1 &s2  Regular rate and rhythm, without murmurs, rubs, gallops. Lungs: Clear to auscultation bilaterally.   Pertinent Lab Results:none, will check TSH   Medications: Prior to Admission medications   Medication Sig Start Date End Date Taking? Authorizing Provider  feeding supplement, RESOURCE BREEZE, (RESOURCE BREEZE) LIQD Take 1 Container by mouth 3 (three) times daily between meals. 11/10/14  Yes Robbie Lis, MD  ferrous sulfate (FERROUSUL) 325 (65 FE) MG tablet Take 1 tablet (325 mg total) by mouth 3 (three) times daily with meals. 12/29/14  Yes Lorayne Marek, MD  clonazePAM (KLONOPIN) 0.5 MG tablet Take 1  tablet (0.5 mg total) by mouth at bedtime as needed for anxiety. Patient not taking: Reported on 03/08/2015 12/02/14   Ripudeep Krystal Eaton, MD  FLUoxetine (PROZAC) 20 MG tablet Take 1 tablet (20 mg total) by mouth daily. Patient not taking: Reported on 12/29/2014 12/08/14   Arnoldo Morale, MD  mesalamine (LIALDA) 1.2 G EC tablet Take 4 tablets (4.8 g total) by mouth daily with breakfast. Patient not taking: Reported on 03/08/2015 12/29/14   Lorayne Marek, MD  ondansetron (ZOFRAN ODT) 4 MG disintegrating tablet Take 1 tablet (4 mg total) by mouth every 6 (six) hours as needed for nausea or vomiting. Patient not taking: Reported on 03/08/2015 12/29/14   Lorayne Marek, MD  oxyCODONE (OXY IR/ROXICODONE) 5 MG immediate release tablet Take 1 tablet (5 mg total) by mouth every 6 (six) hours as needed for moderate pain. Patient not taking: Reported on 12/29/2014 12/02/14   Ripudeep Krystal Eaton, MD  predniSONE (DELTASONE) 20 MG tablet Take 1 tablet (20 mg total) by mouth daily. Prednisone 70m tab daily until further instructions from your GI doctor. Patient not taking: Reported on 03/08/2015 12/29/14   DLorayne Marek MD  promethazine (PHENERGAN) 25 MG tablet Take 1 tablet (25 mg total) by mouth every 8 (eight) hours as needed for nausea or vomiting. Patient not taking: Reported on 12/29/2014 11/24/14   CDalia Heading PA-C  simethicone (MYLICON) 80 MG chewable tablet Chew 1 tablet (80 mg total) by mouth every 6 (six) hours as needed for flatulence. Patient not taking:  Reported on 03/08/2015 12/02/14   Ripudeep Krystal Eaton, MD    Assessment: 1. Fatigue 2. Dry, flaky skin ?seborrheic dermatitis vs eczema vs fungal 3. Hair loss  Plan: Check TSH Dermatology referral  Follow up:4 weeks w/ Dr. Annitta Needs  The patient was given clear instructions to go to ER or return to medical center if symptoms don't improve, worsen or new problems develop. The patient verbalized understanding. The patient was told to call to get lab results if they  haven't heard anything in the next week.   This note has been created with Surveyor, quantity. Any transcriptional errors are unintentional.   Zettie Pho, PA-C 03/08/2015, 11:41 AM

## 2015-04-01 ENCOUNTER — Emergency Department (HOSPITAL_COMMUNITY)
Admission: EM | Admit: 2015-04-01 | Discharge: 2015-04-01 | Disposition: A | Discharge status: Home / Self Care | Payer: Medicaid Other | Attending: Emergency Medicine | Admitting: Emergency Medicine

## 2015-04-01 ENCOUNTER — Encounter (HOSPITAL_COMMUNITY): Payer: Self-pay | Admitting: *Deleted

## 2015-04-01 DIAGNOSIS — Z8719 Personal history of other diseases of the digestive system: Secondary | ICD-10-CM | POA: Diagnosis not present

## 2015-04-01 DIAGNOSIS — K921 Melena: Secondary | ICD-10-CM

## 2015-04-01 DIAGNOSIS — Z79899 Other long term (current) drug therapy: Secondary | ICD-10-CM | POA: Insufficient documentation

## 2015-04-01 DIAGNOSIS — Z7952 Long term (current) use of systemic steroids: Secondary | ICD-10-CM | POA: Diagnosis not present

## 2015-04-01 DIAGNOSIS — R197 Diarrhea, unspecified: Secondary | ICD-10-CM | POA: Insufficient documentation

## 2015-04-01 LAB — COMPREHENSIVE METABOLIC PANEL
ALBUMIN: 3.3 g/dL — AB (ref 3.5–5.0)
ALT: 26 U/L (ref 17–63)
ANION GAP: 4 — AB (ref 5–15)
AST: 22 U/L (ref 15–41)
Alkaline Phosphatase: 40 U/L (ref 38–126)
BUN: 20 mg/dL (ref 6–20)
CO2: 28 mmol/L (ref 22–32)
Calcium: 8.8 mg/dL — ABNORMAL LOW (ref 8.9–10.3)
Chloride: 104 mmol/L (ref 101–111)
Creatinine, Ser: 0.71 mg/dL (ref 0.61–1.24)
GFR calc non Af Amer: >60 mL/min (ref >60)
Glucose, Bld: 104 mg/dL — ABNORMAL HIGH (ref 65–99)
POTASSIUM: 4.3 mmol/L (ref 3.5–5.1)
SODIUM: 136 mmol/L (ref 135–145)
TOTAL PROTEIN: 6.5 g/dL (ref 6.5–8.1)
Total Bilirubin: 0.2 mg/dL — ABNORMAL LOW (ref 0.3–1.2)

## 2015-04-01 LAB — CBC
HEMATOCRIT: 34.5 % — AB (ref 39.0–52.0)
Hemoglobin: 10.8 g/dL — ABNORMAL LOW (ref 13.0–17.0)
MCH: 24.8 pg — ABNORMAL LOW (ref 26.0–34.0)
MCHC: 31.3 g/dL (ref 30.0–36.0)
MCV: 79.1 fL (ref 78.0–100.0)
Platelets: 290 10*3/uL (ref 150–400)
RBC: 4.36 MIL/uL (ref 4.22–5.81)
RDW: 15.6 % — ABNORMAL HIGH (ref 11.5–15.5)
WBC: 10 10*3/uL (ref 4.0–10.5)

## 2015-04-01 NOTE — Discharge Instructions (Signed)
Bloody Stools Bloody stools means there is blood in your poop (stool). It is a sign that there is a problem somewhere in the digestive system. It is important for your doctor to find the cause of your bleeding, so the problem can be treated.  HOME CARE  Only take medicine as told by your doctor.  Eat foods with fiber (prunes, bran cereals).  Drink enough fluids to keep your pee (urine) clear or pale yellow.  Sit in warm water (sitz bath) for 10 to 15 minutes as told by your doctor.  Know how to take your medicines (enemas, suppositories) if advised by your doctor.  Watch for signs that you are getting better or getting worse. GET HELP RIGHT AWAY IF:   You are not getting better.  You start to get better but then get worse again.  You have new problems.  You have severe bleeding from the place where poop comes out (rectum) that does not stop.  You throw up (vomit) blood.  You feel weak or pass out (faint).  You have a fever. MAKE SURE YOU:   Understand these instructions.  Will watch your condition.  Will get help right away if you are not doing well or get worse. Document Released: 08/13/2009 Document Revised: 11/17/2011 Document Reviewed: 01/10/2011 [facility] Patient Information 2015 [place], [place]. This information is not intended to replace advice given to you by your health care provider. Make sure you discuss any questions you have with your health care provider.

## 2015-04-01 NOTE — ED Notes (Signed)
Pt reports UC Dx 3 months ago. Began having blood in stool and increased stools per day 3 days ago. Denies abd pain. +nausea, denies vomiting.

## 2015-04-01 NOTE — ED Provider Notes (Signed)
CSN: [id number]     Arrival date & time 04/01/15  1454 History   First MD Initiated Contact with Patient 04/01/15 1521     Chief Complaint  Patient presents with  . Blood In Stools  . Nausea     (Consider location/radiation/quality/duration/timing/severity/associated sxs/prior Treatment) Patient is a 19 y.o. male presenting with hematochezia. The history is provided by the patient.  Rectal Bleeding Quality:  Maroon Amount:  Moderate Duration: 3-4. Timing:  Constant Progression:  Unchanged Chronicity:  Recurrent Context comment:  Known UC Similar prior episodes: yes   Relieved by:  Nothing Worsened by:  Nothing tried Ineffective treatments:  None tried Associated symptoms: no abdominal pain, no fever and no vomiting     Past Medical History  Diagnosis Date  . Ulcerative colitis    Past Surgical History  Procedure Laterality Date  . Flexible sigmoidoscopy N/A 11/08/2014    Procedure: FLEXIBLE SIGMOIDOSCOPY;  Surgeon: Jeryl Columbia, MD;  Location: [facility];  Service: Endoscopy;  Laterality: N/A;   Family History  Problem Relation Age of Onset  . Colitis Mother   . Colitis Father    History  Substance Use Topics  . Smoking status: Never Smoker   . Smokeless tobacco: Never Used  . Alcohol Use: No    Review of Systems  Constitutional: Negative for fever.  HENT: Negative for drooling and rhinorrhea.   Eyes: Negative for pain.  Respiratory: Negative for cough and shortness of breath.   Cardiovascular: Negative for chest pain and leg swelling.  Gastrointestinal: Positive for nausea, diarrhea, blood in stool and hematochezia. Negative for vomiting and abdominal pain.  Genitourinary: Negative for dysuria and hematuria.  Musculoskeletal: Negative for gait problem and neck pain.  Skin: Negative for color change.  Neurological: Negative for numbness and headaches.  Hematological: Negative for adenopathy.  Psychiatric/Behavioral: Negative for behavioral problems.  All  other systems reviewed and are negative.     Allergies  Review of patient's allergies indicates no known allergies.  Home Medications   Prior to Admission medications   Medication Sig Start Date End Date Taking? Authorizing Provider  Biotin (BIOTIN MAXIMUM STRENGTH) 10 MG TABS Take 1 tablet by mouth daily.   Yes Historical Provider, MD  feeding supplement, RESOURCE BREEZE, (RESOURCE BREEZE) LIQD Take 1 Container by mouth 3 (three) times daily between meals. 11/10/14  Yes Robbie Lis, MD  ferrous sulfate (FERROUSUL) 325 (65 FE) MG tablet Take 1 tablet (325 mg total) by mouth 3 (three) times daily with meals. 12/29/14  Yes Deepak Advani, MD  Golimumab (SIMPONI [place]) Inject 1 Dose into the skin every 30 (thirty) days.   Yes Historical Provider, MD  predniSONE (DELTASONE) 10 MG tablet Take 15 mg by mouth daily with breakfast.   Yes Historical Provider, MD  Probiotic Product (PROBIOTIC DAILY PO) Take 1 tablet by mouth daily.   Yes Historical Provider, MD  clonazePAM (KLONOPIN) 0.5 MG tablet Take 1 tablet (0.5 mg total) by mouth at bedtime as needed for anxiety. Patient not taking: Reported on 03/08/2015 12/02/14   Ripudeep Krystal Eaton, MD  FLUoxetine (PROZAC) 20 MG tablet Take 1 tablet (20 mg total) by mouth daily. Patient not taking: Reported on 12/29/2014 12/08/14   Arnoldo Morale, MD  mesalamine (LIALDA) 1.2 G EC tablet Take 4 tablets (4.8 g total) by mouth daily with breakfast. Patient not taking: Reported on 03/08/2015 12/29/14   Lorayne Marek, MD  ondansetron (ZOFRAN ODT) 4 MG disintegrating tablet Take 1 tablet (4 mg total) by mouth  every 6 (six) hours as needed for nausea or vomiting. Patient not taking: Reported on 03/08/2015 12/29/14   Lorayne Marek, MD  oxyCODONE (OXY IR/ROXICODONE) 5 MG immediate release tablet Take 1 tablet (5 mg total) by mouth every 6 (six) hours as needed for moderate pain. Patient not taking: Reported on 12/29/2014 12/02/14   Ripudeep Krystal Eaton, MD  predniSONE (DELTASONE) 20 MG tablet  Take 1 tablet (20 mg total) by mouth daily. Prednisone 9m tab daily until further instructions from your GI doctor. Patient not taking: Reported on 03/08/2015 12/29/14   DLorayne Marek MD  promethazine (PHENERGAN) 25 MG tablet Take 1 tablet (25 mg total) by mouth every 8 (eight) hours as needed for nausea or vomiting. Patient not taking: Reported on 12/29/2014 11/24/14   CDalia Heading PA-C  simethicone (MYLICON) 80 MG chewable tablet Chew 1 tablet (80 mg total) by mouth every 6 (six) hours as needed for flatulence. Patient not taking: Reported on 03/08/2015 12/02/14   Ripudeep K Rai, MD   BP 114/58 mmHg  Pulse 88  Temp(Src) 98.2 F (36.8 C) (Oral)  Resp 16  SpO2 98% Physical Exam  Constitutional: He is oriented to person, place, and time. He appears well-developed and well-nourished.  HENT:  Head: Normocephalic and atraumatic.  Right Ear: External ear normal.  Left Ear: External ear normal.  Nose: Nose normal.  Mouth/Throat: Oropharynx is clear and moist. No oropharyngeal exudate.  Eyes: Conjunctivae and EOM are normal. Pupils are equal, round, and reactive to light.  Neck: Normal range of motion. Neck supple.  Cardiovascular: Normal rate, regular rhythm, normal heart sounds and intact distal pulses.  Exam reveals no gallop and no friction rub.   No murmur heard. Pulmonary/Chest: Effort normal and breath sounds normal. No respiratory distress. He has no wheezes.  Abdominal: Soft. Bowel sounds are normal. He exhibits no distension. There is no tenderness. There is no rebound and no guarding.  Musculoskeletal: Normal range of motion. He exhibits no edema or tenderness.  Neurological: He is alert and oriented to person, place, and time.  Skin: Skin is warm and dry.  Psychiatric: He has a normal mood and affect. His behavior is normal.  Nursing note and vitals reviewed.   ED Course  Procedures (including critical care time) Labs Review Labs Reviewed  CBC - Abnormal; Notable for  the following:    Hemoglobin 10.8 (*)    HCT 34.5 (*)    MCH 24.8 (*)    RDW 15.6 (*)    All other components within normal limits  COMPREHENSIVE METABOLIC PANEL - Abnormal; Notable for the following:    Glucose, Bld 104 (*)    Calcium 8.8 (*)    Albumin 3.3 (*)    Total Bilirubin 0.2 (*)    Anion gap 4 (*)    All other components within normal limits    Imaging Review No results found.   EKG Interpretation None      MDM   Final diagnoses:  Bloody stools    3:57 PM 19y.o. male with history of ulcerative colitis who presents with bloody stools which began about 3-4 days ago. He notes about 7-8 grossly bloody stools with clots per day seemed to be worse in the early morning. Denies fevers or abdominal pain or vomiting. He has had some nausea. He notes that he began having increased frequency of stools a little over 1 week ago and was started on a long steroids taper by his GI doctor, Dr. oPaulita Fujita He is  currently taking 15 mg prednisone per day. His vital signs are unremarkable here and he is asymptomatic on exam. Given known ulcerative colitis, do not think rectal exam would contribute to his care today. We'll get screening lab work to screen for anemia. He has noted some fatigue.   Of note, the patient was admitted in March of this year with symptomatic anemia.  5:01 PM: I interpreted/reviewed the labs and/or imaging which were non-contributory.  Pt continues to appear well.  I have discussed the diagnosis/risks/treatment options with the patient and believe the pt to be eligible for discharge home to follow-up with his GI doctor as needed. We also discussed returning to the ED immediately if new or worsening sx occur. We discussed the sx which are most concerning (e.g., abd pain, fever, worsening fatigue, sob, worsening bloody stools) that necessitate immediate return. Medications administered to the patient during their visit and any new prescriptions provided to the patient are  listed below.  Medications given during this visit Medications - No data to display  New Prescriptions   No medications on file     Pamella Pert, MD 04/01/15 1702

## 2015-04-05 ENCOUNTER — Ambulatory Visit: Payer: Medicaid Other | Admitting: Internal Medicine

## 2015-04-11 ENCOUNTER — Encounter: Payer: Self-pay | Admitting: Internal Medicine

## 2015-04-11 ENCOUNTER — Ambulatory Visit: Payer: Medicaid Other | Attending: Internal Medicine | Admitting: Internal Medicine

## 2015-04-11 VITALS — BP 110/66 | HR 81 | Temp 98.0°F | Resp 16 | Wt 126.0 lb

## 2015-04-11 DIAGNOSIS — K51918 Ulcerative colitis, unspecified with other complication: Secondary | ICD-10-CM

## 2015-04-11 DIAGNOSIS — Z7952 Long term (current) use of systemic steroids: Secondary | ICD-10-CM | POA: Diagnosis not present

## 2015-04-11 DIAGNOSIS — D509 Iron deficiency anemia, unspecified: Secondary | ICD-10-CM | POA: Insufficient documentation

## 2015-04-11 DIAGNOSIS — Z79899 Other long term (current) drug therapy: Secondary | ICD-10-CM | POA: Insufficient documentation

## 2015-04-11 DIAGNOSIS — K5190 Ulcerative colitis, unspecified, without complications: Secondary | ICD-10-CM | POA: Diagnosis present

## 2015-04-11 NOTE — Progress Notes (Signed)
MRN: [id number] Name: Spencer Williams  Sex: male Age: 19 y.o. DOB: 02-22-1996  Allergies: Review of patient's allergies indicates no known allergies.  Chief Complaint  Patient presents with  . Follow-up    HPI: Patient is 19 y.o. male who has history of ulcerative colitis last month he was seen in our office in the walk-in clinic, and that area concern about hair loss, subsequently had blood work done his TSH level was normal, he also recently went to the emergency room with the flare of ulcerative colitis, EMR reviewed his CBC shows stable hemoglobin level he still has anemia, patient has not been taking iron supplement 3 times, as per patient yesterday he saw his gastroenterologist and was prescribed budesonide which patient has started and reports some improvement still has used all moment with some blood, denies any fever chills nausea vomiting, patient is also on simponi s/c injection once a month as per patient he is going to get it soon.  Past Medical History  Diagnosis Date  . Ulcerative colitis     Past Surgical History  Procedure Laterality Date  . Flexible sigmoidoscopy N/A 11/08/2014    Procedure: FLEXIBLE SIGMOIDOSCOPY;  Surgeon: Jeryl Columbia, MD;  Location: [facility];  Service: Endoscopy;  Laterality: N/A;      Medication List       This list is accurate as of: 04/11/15  5:31 PM.  Always use your most recent med list.               BIOTIN MAXIMUM STRENGTH 10 MG Tabs  Generic drug:  Biotin  Take 1 tablet by mouth daily.     Budesonide 9 MG Tb24  Take by mouth.     clonazePAM 0.5 MG tablet  Commonly known as:  KLONOPIN  Take 1 tablet (0.5 mg total) by mouth at bedtime as needed for anxiety.     feeding supplement Liqd  Take 1 Container by mouth 3 (three) times daily between meals.     ferrous sulfate 325 (65 FE) MG tablet  Commonly known as:  FERROUSUL  Take 1 tablet (325 mg total) by mouth 3 (three) times daily with meals.     FLUoxetine 20 MG  tablet  Commonly known as:  PROZAC  Take 1 tablet (20 mg total) by mouth daily.     mesalamine 1.2 G EC tablet  Commonly known as:  LIALDA  Take 4 tablets (4.8 g total) by mouth daily with breakfast.     ondansetron 4 MG disintegrating tablet  Commonly known as:  ZOFRAN ODT  Take 1 tablet (4 mg total) by mouth every 6 (six) hours as needed for nausea or vomiting.     oxyCODONE 5 MG immediate release tablet  Commonly known as:  Oxy IR/ROXICODONE  Take 1 tablet (5 mg total) by mouth every 6 (six) hours as needed for moderate pain.     predniSONE 10 MG tablet  Commonly known as:  DELTASONE  Take 15 mg by mouth daily with breakfast.     predniSONE 20 MG tablet  Commonly known as:  DELTASONE  Take 1 tablet (20 mg total) by mouth daily. Prednisone 38m tab daily until further instructions from your GI doctor.     PROBIOTIC DAILY PO  Take 1 tablet by mouth daily.     promethazine 25 MG tablet  Commonly known as:  PHENERGAN  Take 1 tablet (25 mg total) by mouth every 8 (eight) hours as needed for nausea or vomiting.  simethicone 80 MG chewable tablet  Commonly known as:  MYLICON  Chew 1 tablet (80 mg total) by mouth every 6 (six) hours as needed for flatulence.     SIMPONI [place]  Inject 1 Dose into the skin every 30 (thirty) days.        Meds ordered this encounter  Medications  . Budesonide 9 MG TB24    Sig: Take by mouth.    Immunization History  Administered Date(s) Administered  . Influenza,inj,Quad PF,36+ Mos 11/11/2014    Family History  Problem Relation Age of Onset  . Colitis Mother   . Colitis Father     History  Substance Use Topics  . Smoking status: Never Smoker   . Smokeless tobacco: Never Used  . Alcohol Use: No    Review of Systems   As noted in HPI  Filed Vitals:   04/11/15 1641  BP: 110/66  Pulse: 81  Temp: 98 F (36.7 C)  Resp: 16    Physical Exam  Physical Exam  Constitutional: He is well-developed, well-nourished, and in no  distress. No distress.  Eyes: EOM are normal. Pupils are equal, round, and reactive to light.  Cardiovascular: Normal rate and regular rhythm.   Pulmonary/Chest: Breath sounds normal. No respiratory distress. He has no wheezes. He has no rales.  Musculoskeletal: He exhibits no edema.    Labs   Lab Results  Component Value Date   WBC 10.0 04/01/2015   HGB 10.8* 04/01/2015   HCT 34.5* 04/01/2015   PLT 290 04/01/2015   GLUCOSE 104* 04/01/2015   ALT 26 04/01/2015   AST 22 04/01/2015   NA 136 04/01/2015   K 4.3 04/01/2015   CL 104 04/01/2015   CREATININE 0.71 04/01/2015   BUN 20 04/01/2015   CO2 28 04/01/2015   TSH 1.932 03/08/2015   INR 1.26 11/27/2014    No results found for: HGBA1C   Assessment and Plan  Ulcerative colitis, other complication Patient is taking budesonide for flareup , also advise patient for BRAT diet to help with loose bowel movements ( patient reported that he was checked for C. Difficile which was negative) Patient is also on once a month SIMPONI  subcutaneous injection , following up with GI.  Anemia, iron deficiency I have advised patient to take iron supplement 3 times a day, repeat CBC on the following visit.   Return in about 3 months (around 07/12/2015) for anemia.   This note has been created with Surveyor, quantity. Any transcriptional errors are unintentional.    Lorayne Marek, MD

## 2015-04-11 NOTE — Patient Instructions (Signed)

## 2015-04-11 NOTE — Progress Notes (Signed)
Patient here for follow up Patient states his colitis has flaired up He has a decreased appetite Not sleeping well because he has to keep getting up to use the bathroom Having some bloody stools Patient states his symptoms started about two weeks ago

## 2015-04-23 ENCOUNTER — Inpatient Hospital Stay (HOSPITAL_COMMUNITY): Payer: Medicaid Other

## 2015-04-23 ENCOUNTER — Inpatient Hospital Stay (HOSPITAL_COMMUNITY)
Admission: EM | Admit: 2015-04-23 | Discharge: 2015-04-26 | DRG: 385 | Disposition: A | Discharge status: Home / Self Care | Payer: Medicaid Other | Attending: Internal Medicine | Admitting: Internal Medicine

## 2015-04-23 ENCOUNTER — Encounter (HOSPITAL_COMMUNITY): Payer: Self-pay

## 2015-04-23 DIAGNOSIS — E43 Unspecified severe protein-calorie malnutrition: Secondary | ICD-10-CM | POA: Diagnosis present

## 2015-04-23 DIAGNOSIS — K51811 Other ulcerative colitis with rectal bleeding: Principal | ICD-10-CM | POA: Diagnosis present

## 2015-04-23 DIAGNOSIS — K51911 Ulcerative colitis, unspecified with rectal bleeding: Secondary | ICD-10-CM | POA: Diagnosis not present

## 2015-04-23 DIAGNOSIS — Z6820 Body mass index (BMI) 20.0-20.9, adult: Secondary | ICD-10-CM

## 2015-04-23 DIAGNOSIS — Z228 Carrier of other infectious diseases: Secondary | ICD-10-CM

## 2015-04-23 DIAGNOSIS — R109 Unspecified abdominal pain: Secondary | ICD-10-CM

## 2015-04-23 DIAGNOSIS — Z229 Carrier of infectious disease, unspecified: Secondary | ICD-10-CM | POA: Diagnosis not present

## 2015-04-23 DIAGNOSIS — K5190 Ulcerative colitis, unspecified, without complications: Secondary | ICD-10-CM | POA: Diagnosis not present

## 2015-04-23 DIAGNOSIS — D649 Anemia, unspecified: Secondary | ICD-10-CM | POA: Diagnosis present

## 2015-04-23 DIAGNOSIS — I959 Hypotension, unspecified: Secondary | ICD-10-CM | POA: Diagnosis present

## 2015-04-23 DIAGNOSIS — D500 Iron deficiency anemia secondary to blood loss (chronic): Secondary | ICD-10-CM | POA: Diagnosis not present

## 2015-04-23 DIAGNOSIS — Z79899 Other long term (current) drug therapy: Secondary | ICD-10-CM

## 2015-04-23 DIAGNOSIS — D62 Acute posthemorrhagic anemia: Secondary | ICD-10-CM | POA: Diagnosis not present

## 2015-04-23 LAB — COMPREHENSIVE METABOLIC PANEL
ALK PHOS: 41 U/L (ref 38–126)
ALT: 10 U/L — AB (ref 17–63)
AST: 13 U/L — AB (ref 15–41)
Albumin: 3 g/dL — ABNORMAL LOW (ref 3.5–5.0)
Anion gap: 7 (ref 5–15)
BILIRUBIN TOTAL: 0.2 mg/dL — AB (ref 0.3–1.2)
BUN: 9 mg/dL (ref 6–20)
CALCIUM: 9 mg/dL (ref 8.9–10.3)
CHLORIDE: 103 mmol/L (ref 101–111)
CO2: 27 mmol/L (ref 22–32)
CREATININE: 0.9 mg/dL (ref 0.61–1.24)
GFR calc Af Amer: >60 mL/min (ref >60)
Glucose, Bld: 90 mg/dL (ref 65–99)
Potassium: 4.2 mmol/L (ref 3.5–5.1)
Sodium: 137 mmol/L (ref 135–145)
Total Protein: 6.8 g/dL (ref 6.5–8.1)

## 2015-04-23 LAB — CBC
HCT: 33.6 % — ABNORMAL LOW (ref 39.0–52.0)
Hemoglobin: 10.6 g/dL — ABNORMAL LOW (ref 13.0–17.0)
MCH: 24.7 pg — ABNORMAL LOW (ref 26.0–34.0)
MCHC: 31.5 g/dL (ref 30.0–36.0)
MCV: 78.3 fL (ref 78.0–100.0)
PLATELETS: 443 10*3/uL — AB (ref 150–400)
RBC: 4.29 MIL/uL (ref 4.22–5.81)
RDW: 14.3 % (ref 11.5–15.5)
WBC: 7.6 10*3/uL (ref 4.0–10.5)

## 2015-04-23 LAB — TYPE AND SCREEN
ABO/RH(D): O POS
Antibody Screen: NEGATIVE

## 2015-04-23 LAB — POC OCCULT BLOOD, ED: Fecal Occult Bld: POSITIVE — AB

## 2015-04-23 MED ORDER — ACETAMINOPHEN 325 MG PO TABS: 650.0000 mg | ORAL_TABLET | Freq: Four times a day (QID) | ORAL | Status: DC | PRN

## 2015-04-23 MED ORDER — SODIUM CHLORIDE 0.9 % IJ SOLN: 3.0000 mL | Freq: Two times a day (BID) | INTRAMUSCULAR | Status: DC

## 2015-04-23 MED ORDER — PROBIOTIC DAILY PO CAPS: ORAL_CAPSULE | Freq: Every day | ORAL | Status: DC

## 2015-04-23 MED ORDER — ACETAMINOPHEN 650 MG RE SUPP: 650.0000 mg | Freq: Four times a day (QID) | RECTAL | Status: DC | PRN

## 2015-04-23 MED ORDER — ONDANSETRON HCL 4 MG/2ML IJ SOLN: 4.0000 mg | Freq: Four times a day (QID) | INTRAMUSCULAR | Status: DC | PRN

## 2015-04-23 MED ORDER — FENTANYL CITRATE (PF) 100 MCG/2ML IJ SOLN
50.0000 ug | Freq: Once | INTRAMUSCULAR | Status: AC
  Administered 2015-04-23: 50 ug via INTRAVENOUS
  Filled 2015-04-23: qty 2

## 2015-04-23 MED ORDER — SODIUM CHLORIDE 0.9 % IV SOLN
INTRAVENOUS | Status: DC
  Administered 2015-04-23 – 2015-04-26 (×6): via INTRAVENOUS

## 2015-04-23 MED ORDER — FERROUS SULFATE 325 (65 FE) MG PO TABS
325.0000 mg | ORAL_TABLET | Freq: Three times a day (TID) | ORAL | Status: DC
  Administered 2015-04-24 – 2015-04-26 (×8): 325 mg via ORAL
  Filled 2015-04-23 (×11): qty 1

## 2015-04-23 MED ORDER — METHYLPREDNISOLONE SODIUM SUCC 125 MG IJ SOLR
60.0000 mg | Freq: Every day | INTRAMUSCULAR | Status: DC
  Administered 2015-04-23 – 2015-04-26 (×4): 60 mg via INTRAVENOUS
  Filled 2015-04-23 (×2): qty 2
  Filled 2015-04-23 (×2): qty 0.96

## 2015-04-23 MED ORDER — SODIUM CHLORIDE 0.9 % IV BOLUS (SEPSIS)
1000.0000 mL | Freq: Once | INTRAVENOUS | Status: AC
  Administered 2015-04-23: 1000 mL via INTRAVENOUS

## 2015-04-23 MED ORDER — METRONIDAZOLE IN NACL 5-0.79 MG/ML-% IV SOLN
500.0000 mg | Freq: Three times a day (TID) | INTRAVENOUS | Status: DC
  Administered 2015-04-23 – 2015-04-26 (×8): 500 mg via INTRAVENOUS
  Filled 2015-04-23 (×9): qty 100

## 2015-04-23 MED ORDER — CIPROFLOXACIN IN D5W 400 MG/200ML IV SOLN
400.0000 mg | INTRAVENOUS | Status: AC
  Administered 2015-04-23: 400 mg via INTRAVENOUS
  Filled 2015-04-23: qty 200

## 2015-04-23 MED ORDER — RISAQUAD PO CAPS
1.0000 | ORAL_CAPSULE | Freq: Every day | ORAL | Status: DC
  Administered 2015-04-24 – 2015-04-26 (×3): 1 via ORAL
  Filled 2015-04-23 (×3): qty 1

## 2015-04-23 MED ORDER — ONDANSETRON HCL 4 MG/2ML IJ SOLN
4.0000 mg | Freq: Once | INTRAMUSCULAR | Status: AC
  Administered 2015-04-23: 4 mg via INTRAVENOUS
  Filled 2015-04-23: qty 2

## 2015-04-23 MED ORDER — ONDANSETRON HCL 4 MG PO TABS: 4.0000 mg | ORAL_TABLET | Freq: Four times a day (QID) | ORAL | Status: DC | PRN

## 2015-04-23 MED ORDER — CIPROFLOXACIN IN D5W 400 MG/200ML IV SOLN
400.0000 mg | Freq: Two times a day (BID) | INTRAVENOUS | Status: DC
  Administered 2015-04-24 – 2015-04-26 (×5): 400 mg via INTRAVENOUS
  Filled 2015-04-23 (×5): qty 200

## 2015-04-23 NOTE — ED Provider Notes (Signed)
CSN: [id number]     Arrival date & time 04/23/15  1719 History   First MD Initiated Contact with Patient 04/23/15 1802     Chief Complaint  Patient presents with  . Rectal Bleeding     (Consider location/radiation/quality/duration/timing/severity/associated sxs/prior Treatment) HPI Comments: Brailen Macneal is a 19 y.o. male with a PMHx of ulcerative colitis, who presents to the ED with complaints of lower abdominal pain, rectal bleeding, nausea, diarrhea, and intermittent rectal pain 3 weeks. He reports that this is the same as his ulcerative colitis flares. He reports the pain in his lower abdomen is 7/10 intermittent cramping which is nonradiating worse with feeling like he needs to have a bowel movement, and unrelieved with Tylenol. Rectal bleeding is bright red, with some passage of clots. He has had 15 episodes daily of diarrhea. The rectal pain he describes as feeling like "a bruise". He states that he called his gastroenterologist Dr. Paulita Fujita who told him to come to the ER to get admitted. He has recently been on Uceris for his UC flare, he stopped this 2 days ago after which time his flare worsened. He denies any fevers, chills, chest pain, shortness breath, vomiting, constipation, obstipation, testicular pain or swelling, penile discharge, dysuria, hematuria, numbness, tingling, weakness, lightheadedness or syncope, recent travel, sick contacts, suspicious food intake, antibiotic use, alcohol use, or NSAIDs.  Patient is a 19 y.o. male presenting with hematochezia. The history is provided by the patient. No language interpreter was used.  Rectal Bleeding Quality:  Bright red Amount:  Moderate Duration:  3 weeks Timing:  Constant Progression:  Worsening Chronicity:  Recurrent Context comment:  UC flare Similar prior episodes: yes   Relieved by:  None tried Worsened by:  Defecation Ineffective treatments:  None tried Associated symptoms: abdominal pain   Associated symptoms: no fever,  no hematemesis, no light-headedness and no vomiting   Risk factors: hx of IBD     Past Medical History  Diagnosis Date  . Ulcerative colitis    Past Surgical History  Procedure Laterality Date  . Flexible sigmoidoscopy N/A 11/08/2014    Procedure: FLEXIBLE SIGMOIDOSCOPY;  Surgeon: Jeryl Columbia, MD;  Location: [facility];  Service: Endoscopy;  Laterality: N/A;   Family History  Problem Relation Age of Onset  . Colitis Mother   . Colitis Father    Social History  Substance Use Topics  . Smoking status: Never Smoker   . Smokeless tobacco: Never Used  . Alcohol Use: No    Review of Systems  Constitutional: Negative for fever and chills.  Respiratory: Negative for shortness of breath.   Cardiovascular: Negative for chest pain.  Gastrointestinal: Positive for nausea, abdominal pain, diarrhea, blood in stool, hematochezia and rectal pain (intermittent, "like a bruise"). Negative for vomiting, constipation and hematemesis.  Genitourinary: Negative for dysuria, hematuria, flank pain, discharge, scrotal swelling and testicular pain.  Musculoskeletal: Negative for myalgias and arthralgias.  Skin: Negative for color change.  Allergic/Immunologic: Negative for immunocompromised state.  Neurological: Negative for syncope, weakness, light-headedness and numbness.  Psychiatric/Behavioral: Negative for confusion.   10 Systems reviewed and are negative for acute change except as noted in the HPI.    Allergies  Review of patient's allergies indicates no known allergies.  Home Medications   Prior to Admission medications   Medication Sig Start Date End Date Taking? Authorizing Provider  Biotin (BIOTIN MAXIMUM STRENGTH) 10 MG TABS Take 1 tablet by mouth daily.    Historical Provider, MD  clonazePAM (KLONOPIN) 0.5 MG  tablet Take 1 tablet (0.5 mg total) by mouth at bedtime as needed for anxiety. Patient not taking: Reported on 03/08/2015 12/02/14   Ripudeep Krystal Eaton, MD  feeding supplement,  RESOURCE BREEZE, (RESOURCE BREEZE) LIQD Take 1 Container by mouth 3 (three) times daily between meals. 11/10/14   Robbie Lis, MD  ferrous sulfate (FERROUSUL) 325 (65 FE) MG tablet Take 1 tablet (325 mg total) by mouth 3 (three) times daily with meals. 12/29/14   Lorayne Marek, MD  FLUoxetine (PROZAC) 20 MG tablet Take 1 tablet (20 mg total) by mouth daily. Patient not taking: Reported on 12/29/2014 12/08/14   Arnoldo Morale, MD  Golimumab (SIMPONI [place]) Inject 1 Dose into the skin every 30 (thirty) days.    Historical Provider, MD  mesalamine (LIALDA) 1.2 G EC tablet Take 4 tablets (4.8 g total) by mouth daily with breakfast. Patient not taking: Reported on 03/08/2015 12/29/14   Lorayne Marek, MD  ondansetron (ZOFRAN ODT) 4 MG disintegrating tablet Take 1 tablet (4 mg total) by mouth every 6 (six) hours as needed for nausea or vomiting. Patient not taking: Reported on 03/08/2015 12/29/14   Lorayne Marek, MD  oxyCODONE (OXY IR/ROXICODONE) 5 MG immediate release tablet Take 1 tablet (5 mg total) by mouth every 6 (six) hours as needed for moderate pain. Patient not taking: Reported on 12/29/2014 12/02/14   Ripudeep Krystal Eaton, MD  predniSONE (DELTASONE) 20 MG tablet Take 1 tablet (20 mg total) by mouth daily. Prednisone 56m tab daily until further instructions from your GI doctor. Patient not taking: Reported on 03/08/2015 12/29/14   DLorayne Marek MD  Probiotic Product (PROBIOTIC DAILY PO) Take 1 tablet by mouth daily.    Historical Provider, MD  promethazine (PHENERGAN) 25 MG tablet Take 1 tablet (25 mg total) by mouth every 8 (eight) hours as needed for nausea or vomiting. Patient not taking: Reported on 12/29/2014 11/24/14   CDalia Heading PA-C  simethicone (MYLICON) 80 MG chewable tablet Chew 1 tablet (80 mg total) by mouth every 6 (six) hours as needed for flatulence. Patient not taking: Reported on 03/08/2015 12/02/14   Ripudeep K Rai, MD   BP 95/53 mmHg  Pulse 90  Temp(Src) 98.7 F (37.1 C) (Oral)  Resp 18   SpO2 99% Physical Exam  Constitutional: He is oriented to person, place, and time. Vital signs are normal. He appears well-developed and well-nourished.  Non-toxic appearance. No distress.  Afebrile, nontoxic, NAD. Mildly hypotensive with BP 99/53.   HENT:  Head: Normocephalic and atraumatic.  Mouth/Throat: Oropharynx is clear and moist and mucous membranes are normal.  Eyes: Conjunctivae and EOM are normal. Right eye exhibits no discharge. Left eye exhibits no discharge.  Neck: Normal range of motion. Neck supple.  Cardiovascular: Normal rate, regular rhythm, normal heart sounds and intact distal pulses.  Exam reveals no gallop and no friction rub.   No murmur heard. Pulmonary/Chest: Effort normal and breath sounds normal. No respiratory distress. He has no decreased breath sounds. He has no wheezes. He has no rhonchi. He has no rales.  Abdominal: Soft. Normal appearance and bowel sounds are normal. He exhibits no distension. There is tenderness in the right lower quadrant, suprapubic area and left lower quadrant. There is no rigidity, no rebound, no guarding, no CVA tenderness, no tenderness at McBurney's point and negative Murphy's sign.    Soft, nondistended, +BS throughout, with mild diffuse lower abd tenderness, no r/g/r, neg murphy's, neg mcburney's, neg psoas sign, neg foot tap test, no CVA TTP  Genitourinary: Rectal exam shows no external hemorrhoid, no internal hemorrhoid, no fissure, no mass, no tenderness and anal tone normal. Guaiac positive stool.  Chaperone present No gross blood noted on rectal exam, normal tone, no tenderness, no mass or fissure, no hemorrhoids. FOBT+  Musculoskeletal: Normal range of motion.  Neurological: He is alert and oriented to person, place, and time. He has normal strength. No sensory deficit.  Skin: Skin is warm, dry and intact. No rash noted.  Psychiatric: He has a normal mood and affect.  Nursing note and vitals reviewed.   ED Course    Procedures (including critical care time) Labs Review Labs Reviewed  COMPREHENSIVE METABOLIC PANEL - Abnormal; Notable for the following:    Albumin 3.0 (*)    AST 13 (*)    ALT 10 (*)    Total Bilirubin 0.2 (*)    All other components within normal limits  CBC - Abnormal; Notable for the following:    Hemoglobin 10.6 (*)    HCT 33.6 (*)    MCH 24.7 (*)    Platelets 443 (*)    All other components within normal limits  POC OCCULT BLOOD, ED - Abnormal; Notable for the following:    Fecal Occult Bld POSITIVE (*)    All other components within normal limits  URINALYSIS, ROUTINE W REFLEX MICROSCOPIC (NOT AT [facility])  TYPE AND SCREEN    Imaging Review No results found. I, Camprubi-Soms, Patty Sermons, personally reviewed and evaluated these images and lab results as part of my medical decision-making.   EKG Interpretation None      MDM   Final diagnoses:  Ulcerative colitis, with rectal bleeding  Anemia, unspecified anemia type    19 y.o. male here with hematochezia, low abd cramping, diarrhea, and nausea x3 wks. States this is his usual UC flare but it's worsening despite being treated on uceris last week. On exam, mildly hypotensive, mildly tender in lower abdomen without r/g/r, no mcburney's tenderness, afebrile, with rectal exam without hemorrhoids or obvious gross blood. Pt states he called Dr. Paulita Fujita and he was instructed to come to the ED to get admitted. Will get basic labs and consult GI to discuss management. Will give some pain meds and nausea meds, will start with fentanyl now since BP low, will await for BP to improve before trying other meds. Will reassess shortly.  6:41 PM Dr. Oletta Lamas returning page, wants hospitalist admission, will consult while pt is admitted.  7:23 PM Dr. Posey Pronto returning page, will admit. Of note, labs reveal stable H/H at 10.6. CMP unremarkable. FOBT+. BP improving with fluids. Please see Dr. Serita Grit note for further documentation of  care.  BP 103/47 mmHg  Pulse 86  Temp(Src) 98.4 F (36.9 C) (Oral)  Resp 18  SpO2 100%  Meds ordered this encounter  Medications  . fentaNYL (SUBLIMAZE) injection 50 mcg    Sig:   . ondansetron (ZOFRAN) injection 4 mg    Sig:   . sodium chloride 0.9 % bolus 1,000 mL    Sig:      Aloysious Vangieson Camprubi-Soms, PA-C 04/23/15 1924  Merrily Pew, MD 04/24/15 0101

## 2015-04-23 NOTE — Progress Notes (Signed)
ANTIBIOTIC CONSULT NOTE - INITIAL  Pharmacy Consult for ciprofloxacin Indication: colitis  No Known Allergies  Patient Measurements:    Vital Signs: Temp: 98.7 F (37.1 C) (08/15 2200) Temp Source: Oral (08/15 2200) BP: 106/47 mmHg (08/15 2200) Pulse Rate: 73 (08/15 2200) Intake/Output from previous day:   Intake/Output from this shift:    Labs:  Recent Labs  04/23/15 1753  WBC 7.6  HGB 10.6*  PLT 443*  CREATININE 0.90   Estimated Creatinine Clearance: 105.9 mL/min (by C-G formula based on Cr of 0.9). No results for input(s): VANCOTROUGH, VANCOPEAK, VANCORANDOM, GENTTROUGH, GENTPEAK, GENTRANDOM, TOBRATROUGH, TOBRAPEAK, TOBRARND, AMIKACINPEAK, AMIKACINTROU, AMIKACIN in the last 72 hours.   Microbiology: No results found for this or any previous visit (from the past 720 hour(s)).  Medical History: Past Medical History  Diagnosis Date  . Ulcerative colitis     Medications:  Anti-infectives    Start     Dose/Rate Route Frequency Ordered Stop   04/23/15 2045  ciprofloxacin (CIPRO) IVPB 400 mg     400 mg 200 mL/hr over 60 Minutes Intravenous STAT 04/23/15 2033 04/24/15 2045   04/23/15 2030  metroNIDAZOLE (FLAGYL) IVPB 500 mg     500 mg 100 mL/hr over 60 Minutes Intravenous Every 8 hours 04/23/15 2015       Assessment: 19 y.o. male admitted 04/23/2015 for UC flare with BRBPR and frequent diarrhea.  To begin ciprofloxacin per pharmacy and also on metronidazole.   Goal of Therapy:  Eradication of infection Appropriate antibiotic dosing for indication and renal function  Plan:  Day 1 antibiotics  Ciprofloxacin 400 mg IV q12 hr  Follow clinical course, renal function, culture results as available  Follow for de-escalation of antibiotics and LOT   Reuel Boom, PharmD, BCPS Pager: [phone] 04/23/2015, 10:27 PM

## 2015-04-23 NOTE — ED Notes (Signed)
Report given to floor nurse; receiving nurse will call when room is ready.  Pt waiting on abd xray. Pt is aware that urine sample is needed.

## 2015-04-23 NOTE — ED Notes (Signed)
Attempted calling report, no answer in ICU/SD.

## 2015-04-23 NOTE — H&P (Signed)
[facility] History and Physical  Patient: Spencer Williams  MRN: [id number]  DOB: Jun 27, 1996  DOS: the patient was seen and examined on 04/23/2015 PCP: Lorayne Marek, MD  Referring physician: Merrily Pew, MD Chief Complaint: GI bleed  HPI: [name] is a 19 y.o. male with Past medical history of ulcerative colitis. The patient is presenting with complaints of multiple episodes of diarrhea with bright red blood per rectum. The patient mentions that 3 weeks ago he started having increasing episodes of diarrhea with few episodes of bright red blood per rectum. He was placed on prednisone regimen without any significant benefit. He was later on switched to budesonide which she is taking currently but since last one week he continues to have more than 10 bowel movements in a day with bright red blood. He also started having increasing pain in the lower abdominal area. Denies any nausea or vomiting and denies any chest pain shortness of breath cough or bleeding anywhere else or burning urination. He mentions he feels fatigue and tired and has lack of sleep. He also has lack of appetite. He has lost some weight.  The patient is coming from home.  At his baseline ambulates without any support And is independent for most of his ADL manages her medication on his own.  Review of Systems: as mentioned in the history of present illness.  A comprehensive review of the other systems is negative.  Past Medical History  Diagnosis Date  . Ulcerative colitis    Past Surgical History  Procedure Laterality Date  . Flexible sigmoidoscopy N/A 11/08/2014    Procedure: FLEXIBLE SIGMOIDOSCOPY;  Surgeon: Jeryl Columbia, MD;  Location: [facility];  Service: Endoscopy;  Laterality: N/A;   Social History:  reports that he has never smoked. He has never used smokeless tobacco. He reports that he does not drink alcohol or use illicit drugs.  No Known Allergies  Family History  Problem Relation Age of  Onset  . Colitis Mother   . Colitis Father     Prior to Admission medications   Medication Sig Start Date End Date Taking? Authorizing Provider  acetaminophen (TYLENOL) 500 MG tablet Take 1,000 mg by mouth every 6 (six) hours as needed for mild pain, moderate pain or headache.   Yes Historical Provider, MD  Biotin (BIOTIN MAXIMUM STRENGTH) 10 MG TABS Take 1 tablet by mouth daily.   Yes Historical Provider, MD  Budesonide 9 MG TB24 Take 1 tablet by mouth daily.   Yes Historical Provider, MD  ferrous sulfate (FERROUSUL) 325 (65 FE) MG tablet Take 1 tablet (325 mg total) by mouth 3 (three) times daily with meals. Patient taking differently: Take 325 mg by mouth daily.  12/29/14  Yes Deepak Advani, MD  Golimumab (SIMPONI [place]) Inject 1 Dose into the skin every 30 (thirty) days.   Yes Historical Provider, MD  Multiple Vitamins-Minerals (HAIR/SKIN/NAILS PO) Take 1 tablet by mouth 2 (two) times daily.   Yes Historical Provider, MD  Probiotic Product (PROBIOTIC DAILY PO) Take 1 tablet by mouth daily.   Yes Historical Provider, MD  clonazePAM (KLONOPIN) 0.5 MG tablet Take 1 tablet (0.5 mg total) by mouth at bedtime as needed for anxiety. Patient not taking: Reported on 03/08/2015 12/02/14   Ripudeep Krystal Eaton, MD  feeding supplement, RESOURCE BREEZE, (RESOURCE BREEZE) LIQD Take 1 Container by mouth 3 (three) times daily between meals. Patient not taking: Reported on 04/23/2015 11/10/14   Robbie Lis, MD  FLUoxetine (PROZAC) 20 MG tablet Take  1 tablet (20 mg total) by mouth daily. Patient not taking: Reported on 12/29/2014 12/08/14   Arnoldo Morale, MD  mesalamine (LIALDA) 1.2 G EC tablet Take 4 tablets (4.8 g total) by mouth daily with breakfast. Patient not taking: Reported on 03/08/2015 12/29/14   Lorayne Marek, MD  ondansetron (ZOFRAN ODT) 4 MG disintegrating tablet Take 1 tablet (4 mg total) by mouth every 6 (six) hours as needed for nausea or vomiting. Patient not taking: Reported on 03/08/2015 12/29/14   Lorayne Marek, MD  oxyCODONE (OXY IR/ROXICODONE) 5 MG immediate release tablet Take 1 tablet (5 mg total) by mouth every 6 (six) hours as needed for moderate pain. Patient not taking: Reported on 12/29/2014 12/02/14   Ripudeep Krystal Eaton, MD  predniSONE (DELTASONE) 20 MG tablet Take 1 tablet (20 mg total) by mouth daily. Prednisone 9m tab daily until further instructions from your GI doctor. Patient not taking: Reported on 03/08/2015 12/29/14   DLorayne Marek MD  promethazine (PHENERGAN) 25 MG tablet Take 1 tablet (25 mg total) by mouth every 8 (eight) hours as needed for nausea or vomiting. Patient not taking: Reported on 12/29/2014 11/24/14   CDalia Heading PA-C  simethicone (MYLICON) 80 MG chewable tablet Chew 1 tablet (80 mg total) by mouth every 6 (six) hours as needed for flatulence. Patient not taking: Reported on 03/08/2015 12/02/14   Ripudeep KKrystal Eaton MD    Physical Exam: Filed Vitals:   04/23/15 1733 04/23/15 1823 04/23/15 1856  BP: 95/53 99/53 103/47  Pulse: 90 84 86  Temp: 98.7 F (37.1 C)  98.4 F (36.9 C)  TempSrc: Oral  Oral  Resp: _0 SpO2: 99% 96% 100%    General: Alert, Awake and Oriented to Time, Place and Person. Appear in mild distress Eyes: PERRL ENT: Oral Mucosa clear moist. Neck: no JVD Cardiovascular: S1 and S2 Present, no Murmur, Peripheral Pulses Present Respiratory: Bilateral Air entry equal and Decreased,  Clear to Auscultation, no Crackles, no wheezes Abdomen: Bowel Sound present, Soft and lower abdominal tenderness Skin: no Rash Extremities: no Pedal edema, no calf tenderness Neurologic: Grossly no focal neuro deficit.  Labs on Admission:  CBC:  Recent Labs Lab 04/23/15 1753  WBC 7.6  HGB 10.6*  HCT 33.6*  MCV 78.3  PLT 443*    CMP     Component Value Date/Time   NA 137 04/23/2015 1753   K 4.2 04/23/2015 1753   CL 103 04/23/2015 1753   CO2 27 04/23/2015 1753   GLUCOSE 90 04/23/2015 1753   BUN 9 04/23/2015 1753   CREATININE 0.90  04/23/2015 1753   CREATININE 0.47* 12/08/2014 1211   CALCIUM 9.0 04/23/2015 1753   PROT 6.8 04/23/2015 1753   ALBUMIN 3.0* 04/23/2015 1753   AST 13* 04/23/2015 1753   ALT 10* 04/23/2015 1753   ALKPHOS 41 04/23/2015 1753   BILITOT 0.2* 04/23/2015 1753   GFRNONAA >60 04/23/2015 1753   GFRAA >60 04/23/2015 1753    No results for input(s): LIPASE, AMYLASE in the last 168 hours.  No results for input(s): CKTOTAL, CKMB, CKMBINDEX, TROPONINI in the last 168 hours. BNP (last 3 results) No results for input(s): BNP in the last 8760 hours.  ProBNP (last 3 results) No results for input(s): PROBNP in the last 8760 hours.   Radiological Exams on Admission: No results found. Assessment/Plan Principal Problem:   Ulcerative colitis, acute Active Problems:   Protein-calorie malnutrition, severe   Anemia   1. Ulcerative colitis, acute The patient is  presenting with flare of acute ulcerative colitis. He was seen by his GI provider as an outpatient and was sent here for further workup. Patient will be started on 60 mg IV Solu-Medrol daily also start Cipro and Flagyl. Check ESR and CRP recheck his CBC. Patient will be kept nothing by mouth except medication overnight. GI will see the patient in the morning. Check C. difficile and contact precautions.  2. Anemia. Continuing iron supplements increasing the frequency from once a day to 3 times a day. Add folic acid.  3. Protein calorie malnutrition. Resume oral feedings as soon as possible.  Advance goals of care discussion: full code   Consults: ED discussed with GI.  DVT Prophylaxis: mechanical compression device Nutrition: npo except medication and ice chips  Family Communication: family was present at bedside, opportunity was given to ask question and all questions were answered satisfactorily at the time of interview. Disposition: Admitted as inpatient, step-down unit due to hypotension and active bleeding.  Author:   , MD [facility] Pager: [phone] 04/23/2015  If 7PM-7AM, please contact night-coverage [email or web address] Password [email or web address]  

## 2015-04-23 NOTE — ED Notes (Signed)
Pt c/o rectal bleeding, lower abdominal pain, and ulcerative colitis "flare-up" x 3 weeks.  Pain score 4/10.  Pt reports that he is followed by GI and was directed to come to the ED.  Sts blood is increasing and bright red.

## 2015-04-24 DIAGNOSIS — D62 Acute posthemorrhagic anemia: Secondary | ICD-10-CM

## 2015-04-24 DIAGNOSIS — K51911 Ulcerative colitis, unspecified with rectal bleeding: Secondary | ICD-10-CM

## 2015-04-24 DIAGNOSIS — E43 Unspecified severe protein-calorie malnutrition: Secondary | ICD-10-CM

## 2015-04-24 LAB — COMPREHENSIVE METABOLIC PANEL
ALBUMIN: 2.8 g/dL — AB (ref 3.5–5.0)
ALT: 9 U/L — ABNORMAL LOW (ref 17–63)
ANION GAP: 6 (ref 5–15)
AST: 11 U/L — AB (ref 15–41)
Alkaline Phosphatase: 37 U/L — ABNORMAL LOW (ref 38–126)
BILIRUBIN TOTAL: 0.4 mg/dL (ref 0.3–1.2)
BUN: 8 mg/dL (ref 6–20)
CHLORIDE: 106 mmol/L (ref 101–111)
CO2: 27 mmol/L (ref 22–32)
Calcium: 8.6 mg/dL — ABNORMAL LOW (ref 8.9–10.3)
Creatinine, Ser: 0.86 mg/dL (ref 0.61–1.24)
GFR calc Af Amer: >60 mL/min (ref >60)
GLUCOSE: 129 mg/dL — AB (ref 65–99)
POTASSIUM: 4.9 mmol/L (ref 3.5–5.1)
Sodium: 139 mmol/L (ref 135–145)
TOTAL PROTEIN: 6 g/dL — AB (ref 6.5–8.1)

## 2015-04-24 LAB — URINALYSIS, ROUTINE W REFLEX MICROSCOPIC
BILIRUBIN URINE: NEGATIVE
Glucose, UA: NEGATIVE mg/dL
Hgb urine dipstick: NEGATIVE
Ketones, ur: 15 mg/dL — AB
Leukocytes, UA: NEGATIVE
NITRITE: NEGATIVE
PH: 6.5 (ref 5.0–8.0)
Protein, ur: NEGATIVE mg/dL
SPECIFIC GRAVITY, URINE: 1.016 (ref 1.005–1.030)
Urobilinogen, UA: 0.2 mg/dL (ref 0.0–1.0)

## 2015-04-24 LAB — CBC
HEMATOCRIT: 32.5 % — AB (ref 39.0–52.0)
HEMATOCRIT: 32.4 % — AB (ref 39.0–52.0)
HEMOGLOBIN: 10.3 g/dL — AB (ref 13.0–17.0)
HEMOGLOBIN: 10.2 g/dL — AB (ref 13.0–17.0)
MCH: 25 pg — ABNORMAL LOW (ref 26.0–34.0)
MCH: 24.6 pg — AB (ref 26.0–34.0)
MCHC: 31.7 g/dL (ref 30.0–36.0)
MCHC: 31.5 g/dL (ref 30.0–36.0)
MCV: 78.9 fL (ref 78.0–100.0)
MCV: 78.1 fL (ref 78.0–100.0)
Platelets: 391 10*3/uL (ref 150–400)
Platelets: 412 10*3/uL — ABNORMAL HIGH (ref 150–400)
RBC: 4.12 MIL/uL — ABNORMAL LOW (ref 4.22–5.81)
RBC: 4.15 MIL/uL — ABNORMAL LOW (ref 4.22–5.81)
RDW: 14.1 % (ref 11.5–15.5)
RDW: 14.2 % (ref 11.5–15.5)
WBC: 4.9 10*3/uL (ref 4.0–10.5)
WBC: 8.4 10*3/uL (ref 4.0–10.5)

## 2015-04-24 LAB — C DIFFICILE QUICK SCREEN W PCR REFLEX
C DIFFICILE (CDIFF) TOXIN: NEGATIVE
C Diff antigen: POSITIVE — AB

## 2015-04-24 LAB — SEDIMENTATION RATE: Sed Rate: 42 mm/hr — ABNORMAL HIGH (ref 0–16)

## 2015-04-24 LAB — C-REACTIVE PROTEIN: CRP: 4.6 mg/dL — ABNORMAL HIGH (ref <1.0)

## 2015-04-24 LAB — MRSA PCR SCREENING: MRSA BY PCR: NEGATIVE

## 2015-04-24 NOTE — Progress Notes (Signed)
Pt transferred to [address] via wheelchair with all belongings. Family at bedside. Report given to Sydell Axon, Therapist, sports.

## 2015-04-24 NOTE — Progress Notes (Signed)
Patient has positive c-diff antigen.

## 2015-04-24 NOTE — Progress Notes (Signed)
Patient states that he has school in the morning and was wondering about going home at this time. MD Wyline Copas paged.

## 2015-04-24 NOTE — Progress Notes (Signed)
Pt transferred to the unit. Pt is stable, alert and oriented per baseline. Oriented to room, staff, and call bell. Educated to call for any assistance. Bed in lowest position, call bell within reach- will continue to monitor.

## 2015-04-24 NOTE — Care Management Note (Signed)
Case Management Note  Patient Details  Name: Spencer Williams MRN: [id number] Date of Birth: 1996-05-23  Subjective/Objective:                 Ulcerative colitis flare   Action/Plan:Date:  April 24, 2015 U.R. performed for needs and level of care. Will continue to follow for Case Management needs.  Velva Harman, RN, BSN, [place]   [phone]   Expected Discharge Date:   (unknown)               Expected Discharge Plan:  Home/Self Care  In-House Referral:  NA  Discharge planning Services  CM Consult  Post Acute Care Choice:  NA Choice offered to:  NA  DME Arranged:  N/A DME Agency:  NA  HH Arranged:  NA HH Agency:  NA  Status of Service:  In process, will continue to follow  Medicare Important Message Given:    Date Medicare IM Given:    Medicare IM give by:    Date Additional Medicare IM Given:    Additional Medicare Important Message give by:     If discussed at [place] of Stay Meetings, dates discussed:    Additional Comments:  Leeroy Cha, RN 04/24/2015, 10:49 AM

## 2015-04-24 NOTE — Consult Note (Signed)
Subjective:   HPI  The patient is a 19 year old male who was diagnosed with ulcerative colitis in April of this year by Dr. Paulita Fujita. He has been on prednisone, Uceris, but he states they weren't that helpful and Simponi was also started in April. He has been using these injections regularly. For the past 3 weeks he has noticed a flareup of his altered of colitis with diarrhea and rectal bleeding. He actually thinks that he precipitated this by taking ibuprofen. He is not complaining of abdominal pain. Because of persistent symptoms of diarrhea and rectal bleeding he came to the emergency room where he was subsequently admitted. He has been started on IV Solu-Medrol, and tells me he does feel better today and that was started.  Review of Systems No chest pain or shortness of breath  Past Medical History  Diagnosis Date  . Ulcerative colitis    Past Surgical History  Procedure Laterality Date  . Flexible sigmoidoscopy N/A 11/08/2014    Procedure: FLEXIBLE SIGMOIDOSCOPY;  Surgeon: Jeryl Columbia, MD;  Location: [facility];  Service: Endoscopy;  Laterality: N/A;   Social History   Social History  . Marital Status: Single    Spouse Name: N/A  . Number of Children: N/A  . Years of Education: N/A   Occupational History  . Not on file.   Social History Main Topics  . Smoking status: Never Smoker   . Smokeless tobacco: Never Used  . Alcohol Use: No  . Drug Use: No  . Sexual Activity: Not Currently   Other Topics Concern  . Not on file   Social History Narrative   family history includes Colitis in his father and mother.  Current facility-administered medications:  .  0.9 %  sodium chloride infusion, , Intravenous, Continuous, Lavina Hamman, MD, Last Rate: 100 mL/hr at 04/23/15 2100 .  acetaminophen (TYLENOL) tablet 650 mg, 650 mg, Oral, Q6H PRN **OR** acetaminophen (TYLENOL) suppository 650 mg, 650 mg, Rectal, Q6H PRN, Lavina Hamman, MD .  acidophilus (RISAQUAD) capsule 1 capsule,  1 capsule, Oral, Daily, Lavina Hamman, MD .  ciprofloxacin (CIPRO) IVPB 400 mg, 400 mg, Intravenous, Q12H, Polly Cobia, RPH .  ferrous sulfate tablet 325 mg, 325 mg, Oral, TID WC, Lavina Hamman, MD, 325 mg at 04/24/15 0848 .  methylPREDNISolone sodium succinate (SOLU-MEDROL) 125 mg/2 mL injection 60 mg, 60 mg, Intravenous, Daily, Lavina Hamman, MD, 60 mg at 04/23/15 2100 .  metroNIDAZOLE (FLAGYL) IVPB 500 mg, 500 mg, Intravenous, Q8H, Lavina Hamman, MD, 500 mg at 04/24/15 0410 .  ondansetron (ZOFRAN) tablet 4 mg, 4 mg, Oral, Q6H PRN **OR** ondansetron (ZOFRAN) injection 4 mg, 4 mg, Intravenous, Q6H PRN, Lavina Hamman, MD .  sodium chloride 0.9 % injection 3 mL, 3 mL, Intravenous, Q12H, Lavina Hamman, MD, 3 mL at 04/23/15 2200 No Known Allergies   Objective:     BP 91/45 mmHg  Pulse 53  Temp(Src) 97.6 F (36.4 C) (Oral)  Resp 16  Ht 5' 7"  (1.702 m)  Wt 55.6 kg (122 lb 9.2 oz)  BMI 19.19 kg/m2  SpO2 99%  Alert and oriented and in no acute distress  Nonicteric  Heart regular rhythm no murmurs  Lungs clear  Abdomen: All sounds normal, soft, nontender, no hepatosplenomegaly  Laboratory No components found for: D1    Assessment:     Ulcerative colitis with flareup      Plan:     Agree with use of IV Solu-Medrol.  He states he does feel better today. We will monitor clinical response.

## 2015-04-24 NOTE — Progress Notes (Signed)
[facility] PROGRESS NOTE  Spencer Williams [id number]:[id number] DOB: [birth year] DOA: 04/23/2015 PCP: Lorayne Marek, MD  Assessment/Plan: 1. Ulcerative Colitis 1. GI consulted 2. Pt is continued on solumedrol and cipro/flagyl 3. Pt reports improvement overnight 4. Hgb remains stable 5. Cont to monitor 2. Anemia 1. hgb stable overnight 2. Monitor 3. Protein calorie malnutrition, severe 1. Nutrition following 4. DVT prophylaxis 1. Cont on SCD's  Code Status: Full Family Communication: Pt in room (indicate person spoken with, relationship, and if by phone, the number) Disposition Plan: Pending   Consultants:  GI  Procedures:    Antibiotics:  Ciprofloxacin 8/15>>>  Flagyl 8/15>>>  HPI/Subjective: No complaints. States feeling better. Eager to go back to school.  Objective: Filed Vitals:   04/24/15 0414 04/24/15 0800 04/24/15 1030 04/24/15 1343  BP: 103/29 91/45 113/75 108/47  Pulse: 55 53 62 63  Temp: 98.4 F (36.9 C) 97.6 F (36.4 C) 97.4 F (36.3 C) 97.3 F (36.3 C)  TempSrc: Oral Oral Oral Oral  Resp: 15 16 16 18   Height:   5' 7"  (1.702 m)   Weight:   56.7 kg (125 lb)   SpO2: 100% 99% 100% 100%    Intake/Output Summary (Last 24 hours) at 04/24/15 1812 Last data filed at 04/24/15 1500  Gross per 24 hour  Intake 1393.33 ml  Output    600 ml  Net 793.33 ml   Filed Weights   04/23/15 2000 04/23/15 2200 04/24/15 1030  Weight: 55.6 kg (122 lb 9.2 oz) 55.6 kg (122 lb 9.2 oz) 56.7 kg (125 lb)    Exam:   General:  Awake, in nad  Cardiovascular: regular, s1, s2  Respiratory: normal resp effort, no wheezing  Abdomen: soft,non distended  Musculoskeletal: perfused, no clubbing   Data Reviewed: Basic Metabolic Panel:  Recent Labs Lab 04/23/15 1753 04/24/15 0640  NA 137 139  K 4.2 4.9  CL 103 106  CO2 27 27  GLUCOSE 90 129*  BUN 9 8  CREATININE 0.90 0.86  CALCIUM 9.0 8.6*   Liver Function Tests:  Recent Labs Lab 04/23/15 1753  04/24/15 0640  AST 13* 11*  ALT 10* 9*  ALKPHOS 41 37*  BILITOT 0.2* 0.4  PROT 6.8 6.0*  ALBUMIN 3.0* 2.8*   No results for input(s): LIPASE, AMYLASE in the last 168 hours. No results for input(s): AMMONIA in the last 168 hours. CBC:  Recent Labs Lab 04/23/15 1753 04/24/15 0030 04/24/15 0640  WBC 7.6 8.4 4.9  HGB 10.6* 10.2* 10.3*  HCT 33.6* 32.4* 32.5*  MCV 78.3 78.1 78.9  PLT 443* 391 412*   Cardiac Enzymes: No results for input(s): CKTOTAL, CKMB, CKMBINDEX, TROPONINI in the last 168 hours. BNP (last 3 results) No results for input(s): BNP in the last 8760 hours.  ProBNP (last 3 results) No results for input(s): PROBNP in the last 8760 hours.  CBG: No results for input(s): GLUCAP in the last 168 hours.  Recent Results (from the past 240 hour(s))  MRSA PCR Screening     Status: None   Collection Time: 04/23/15 10:44 PM  Result Value Ref Range Status   MRSA by PCR NEGATIVE NEGATIVE Final    Comment:        The GeneXpert MRSA Assay (FDA approved for NASAL specimens only), is one component of a comprehensive MRSA colonization surveillance program. It is not intended to diagnose MRSA infection nor to guide or monitor treatment for MRSA infections.      Studies: Dg Abd 2 Views  04/23/2015   CLINICAL DATA:  The patient is presenting with complaints of multiple episodes of diarrhea with bright red blood per rectum.The patient mentions that 3 weeks ago he started having increasing episodes of diarrhea with few episodes of bright red blood per rectum. He was placed on prednisone regimen without any significant benefit. He was later on switched to budesonide which she is taking currently but since last one week he continues to have more than 10 bowel movements in a day with bright red blood.He also started having increasing pain in the lower abdominal area.Denies any nausea or vomiting and denies any chest pain shortness of breath cough or bleeding anywhere else or  burning urination.He mentions he feels fatigue and tired and has lack of sleep.He also has lack of appetite.He has lost some weight. Hx ulcerative colitis.  EXAM: ABDOMEN - 2 VIEW  COMPARISON:  11/27/2014  FINDINGS: There is no bowel dilation to suggest obstruction. On the erect view, there are nonspecific air-fluid levels within normal caliber small bowel. This is nonspecific. It could reflect a mild localized adynamic ileus or possibly gastroenteritis.  Mild increased stool burden in the right colon.  Soft tissues are unremarkable.  No free air.  No skeletal abnormality.  IMPRESSION: 1. No evidence of obstruction or free intraperitoneal air. 2. Few air-fluid levels within the small bowel without bowel dilation. Mild adynamic ileus is suspected. 3. Mild increased stool burden in the right colon.   Electronically Signed   By: Lajean Manes M.D.   On: 04/23/2015 20:46    Scheduled Meds: . acidophilus  1 capsule Oral Daily  . ciprofloxacin  400 mg Intravenous Q12H  . ferrous sulfate  325 mg Oral TID WC  . methylPREDNISolone (SOLU-MEDROL) injection  60 mg Intravenous Daily  . metronidazole  500 mg Intravenous Q8H  . sodium chloride  3 mL Intravenous Q12H   Continuous Infusions: . sodium chloride 100 mL/hr at 04/24/15 0955    Principal Problem:   Ulcerative colitis, acute Active Problems:   Protein-calorie malnutrition, severe   Anemia   CHIU, STEPHEN K  [facility] Pager [phone]. If 7PM-7AM, please contact night-coverage at [email or web address], password [facility] 04/24/2015, 6:12 PM  LOS: 1 day

## 2015-04-24 NOTE — Progress Notes (Signed)
Patient BP dropped to 59'R systolic and HR dropped to 40-55 when pt sleeping; Patient HR fine when awake; Neuro status unchanged; MD aware; Will continue to monitor;

## 2015-04-24 NOTE — Progress Notes (Signed)
MD Wyline Copas made aware.

## 2015-04-24 NOTE — Progress Notes (Signed)
Initial Nutrition Assessment  DOCUMENTATION CODES:   Severe malnutrition in context of chronic illness  INTERVENTION:  - Will order Ensure Enlive BID with diet advancement, each supplement provides 350 kcal and 20 grams of protein - Diet advancement as medically feasible - RD will continue to monitor for needs  NUTRITION DIAGNOSIS:   Unintentional weight loss related to chronic illness as evidenced by per patient/family report, percent weight loss.  GOAL:   Patient will meet greater than or equal to 90% of their needs  MONITOR:   Diet advancement, Weight trends, Labs, I & O's  REASON FOR ASSESSMENT:   Malnutrition Screening Tool  ASSESSMENT:   19 y.o. male with Past medical history of ulcerative colitis. The patient is presenting with complaints of multiple episodes of diarrhea with bright red blood per rectum. The patient mentions that 3 weeks ago he started having increasing episodes of diarrhea with few episodes of bright red blood per rectum. He was placed on prednisone regimen without any significant benefit. He was later on switched to budesonide which she is taking currently but since last one week he continues to have more than 10 bowel movements in a day with bright red blood. He also started having increasing pain in the lower abdominal area. Denies any nausea or vomiting and denies any chest pain shortness of breath cough or bleeding anywhere else or burning urination. He mentions he feels fatigue and tired and has lack of sleep. He also has lack of appetite. He has lost some weight.  Pt seen for MST. BMI indicates normal weight status. Pt has been NPO since admission but has had sips of water this AM and reports he is feeling hungry and is asking for diet advancement as soon as able.   He states that 3 weeks ago he took Ibuprofen and that UC flare seemed to be correlated with this. He states that it was mild at first but progressively worsened over the past 3 weeks. He  was eating 3-4 meals/day but over the past few weeks has only been able to consume 1 meal/day due to UC flare.   Pt states he was dx with UC in April of this year. He avoids greasy/fried foods, spicy foods, and high-fiber foods as these exacerbate symptoms for him. He denies any fluids exacerbating fluids and that he was still able to drink adequate fluids during flare.  He states that his UBW before dx was 80 kg and that he has lost weight to CBW since April. This is not consistent with weight hx in medical chart. Per chart review, pt has lost 4 lbs (3% body weight) in the past 12 days which is significant for time frame. Severe fat and moderate muscle wasting also noted.  Not able to meet needs. Medications reviewed. Labs reviewed; Ca: 8.6 mg/dL, LFTs low.   Diet Order:  Diet NPO time specified Except for: Sips with Meds, Ice Chips  Skin:  Reviewed, no issues  Last BM:  8/15  Height:   Ht Readings from Last 1 Encounters:  04/23/15 5' 7"  (1.702 m)    Weight:   Wt Readings from Last 1 Encounters:  04/23/15 122 lb 9.2 oz (55.6 kg)    Ideal Body Weight:  67.27 kg (kg)  BMI:  Body mass index is 19.19 kg/(m^2).  Estimated Nutritional Needs:   Kcal:  8832-5498  Protein:  65-75 grams  Fluid:  >2.5 L/day  EDUCATION NEEDS:   No education needs identified at this time  Jarome Matin, RD, LDN Inpatient Clinical Dietitian Pager # [phone] After hours/weekend pager # [phone]

## 2015-04-25 DIAGNOSIS — K5190 Ulcerative colitis, unspecified, without complications: Secondary | ICD-10-CM

## 2015-04-25 LAB — CBC
HCT: 28.4 % — ABNORMAL LOW (ref 39.0–52.0)
HEMOGLOBIN: 8.7 g/dL — AB (ref 13.0–17.0)
MCH: 23.8 pg — AB (ref 26.0–34.0)
MCHC: 30.6 g/dL (ref 30.0–36.0)
MCV: 77.8 fL — AB (ref 78.0–100.0)
Platelets: 335 10*3/uL (ref 150–400)
RBC: 3.65 MIL/uL — AB (ref 4.22–5.81)
RDW: 14 % (ref 11.5–15.5)
WBC: 7.2 10*3/uL (ref 4.0–10.5)

## 2015-04-25 NOTE — Progress Notes (Signed)
[facility] PROGRESS NOTE  Spencer Williams GSU:[id number] DOB: 1995-10-25 DOA: 04/23/2015 PCP: Lorayne Marek, MD  Assessment/Plan: 1. Ulcerative Colitis 1. GI following 2. Pt is continued on solumedrol and cipro/flagyl 3. Pt reports improvement overnight 4. Hgb remains stable 5. Per GI, cont another day of IV steroids and repeat in AM 2. Anemia 1. hgb remains stable overnight 2. Cont to monitor 3. Protein calorie malnutrition, severe 1. Nutrition following 4. DVT prophylaxis 1. Cont on SCD's  Code Status: Full Family Communication: Pt in room Disposition Plan: Pending   Consultants:  GI  Procedures:    Antibiotics:  Ciprofloxacin 8/15>>>  Flagyl 8/15>>>  HPI/Subjective: Wants to return to school. No complaints  Objective: Filed Vitals:   04/24/15 2116 04/25/15 0437 04/25/15 0445 04/25/15 0636  BP: 106/48 90/34 95/50    Pulse: 75 60    Temp: 97.5 F (36.4 C) 97.5 F (36.4 C)    TempSrc: Oral Oral    Resp: 18 16    Height:      Weight:    58.015 kg (127 lb 14.4 oz)  SpO2: 100% 100%      Intake/Output Summary (Last 24 hours) at 04/25/15 1446 Last data filed at 04/24/15 2300  Gross per 24 hour  Intake   3020 ml  Output    600 ml  Net   2420 ml   Filed Weights   04/23/15 2200 04/24/15 1030 04/25/15 0636  Weight: 55.6 kg (122 lb 9.2 oz) 56.7 kg (125 lb) 58.015 kg (127 lb 14.4 oz)    Exam:   General:  Awake, in nad, laying in bed  Cardiovascular: regular, s1, s2  Respiratory: normal resp effort, no wheezing  Abdomen: soft,non distended, pos BS  Musculoskeletal: perfused, no clubbing   Data Reviewed: Basic Metabolic Panel:  Recent Labs Lab 04/23/15 1753 04/24/15 0640  NA 137 139  K 4.2 4.9  CL 103 106  CO2 27 27  GLUCOSE 90 129*  BUN 9 8  CREATININE 0.90 0.86  CALCIUM 9.0 8.6*   Liver Function Tests:  Recent Labs Lab 04/23/15 1753 04/24/15 0640  AST 13* 11*  ALT 10* 9*  ALKPHOS 41 37*  BILITOT 0.2* 0.4  PROT 6.8  6.0*  ALBUMIN 3.0* 2.8*   No results for input(s): LIPASE, AMYLASE in the last 168 hours. No results for input(s): AMMONIA in the last 168 hours. CBC:  Recent Labs Lab 04/23/15 1753 04/24/15 0030 04/24/15 0640 04/25/15 0420  WBC 7.6 8.4 4.9 7.2  HGB 10.6* 10.2* 10.3* 8.7*  HCT 33.6* 32.4* 32.5* 28.4*  MCV 78.3 78.1 78.9 77.8*  PLT 443* 391 412* 335   Cardiac Enzymes: No results for input(s): CKTOTAL, CKMB, CKMBINDEX, TROPONINI in the last 168 hours. BNP (last 3 results) No results for input(s): BNP in the last 8760 hours.  ProBNP (last 3 results) No results for input(s): PROBNP in the last 8760 hours.  CBG: No results for input(s): GLUCAP in the last 168 hours.  Recent Results (from the past 240 hour(s))  MRSA PCR Screening     Status: None   Collection Time: 04/23/15 10:44 PM  Result Value Ref Range Status   MRSA by PCR NEGATIVE NEGATIVE Final    Comment:        The GeneXpert MRSA Assay (FDA approved for NASAL specimens only), is one component of a comprehensive MRSA colonization surveillance program. It is not intended to diagnose MRSA infection nor to guide or monitor treatment for MRSA infections.   C difficile quick  scan w PCR reflex     Status: Abnormal   Collection Time: 04/24/15  3:20 PM  Result Value Ref Range Status   C Diff antigen POSITIVE (A) NEGATIVE Final    Comment: CRITICAL RESULT CALLED TO, READ BACK BY AND VERIFIED WITH: Gwenevere Abbot [id number] @ [id number] BY J SCOTTON    C Diff toxin NEGATIVE NEGATIVE Final   C Diff interpretation   Final    C. difficile present, but toxin not detected. This indicates colonization. In most cases, this does not require treatment. If patient has signs and symptoms consistent with colitis, consider treatment.     Studies: Dg Abd 2 Views  04/23/2015   CLINICAL DATA:  The patient is presenting with complaints of multiple episodes of diarrhea with bright red blood per rectum.The patient mentions that 3 weeks ago he  started having increasing episodes of diarrhea with few episodes of bright red blood per rectum. He was placed on prednisone regimen without any significant benefit. He was later on switched to budesonide which she is taking currently but since last one week he continues to have more than 10 bowel movements in a day with bright red blood.He also started having increasing pain in the lower abdominal area.Denies any nausea or vomiting and denies any chest pain shortness of breath cough or bleeding anywhere else or burning urination.He mentions he feels fatigue and tired and has lack of sleep.He also has lack of appetite.He has lost some weight. Hx ulcerative colitis.  EXAM: ABDOMEN - 2 VIEW  COMPARISON:  11/27/2014  FINDINGS: There is no bowel dilation to suggest obstruction. On the erect view, there are nonspecific air-fluid levels within normal caliber small bowel. This is nonspecific. It could reflect a mild localized adynamic ileus or possibly gastroenteritis.  Mild increased stool burden in the right colon.  Soft tissues are unremarkable.  No free air.  No skeletal abnormality.  IMPRESSION: 1. No evidence of obstruction or free intraperitoneal air. 2. Few air-fluid levels within the small bowel without bowel dilation. Mild adynamic ileus is suspected. 3. Mild increased stool burden in the right colon.   Electronically Signed   By: Lajean Manes M.D.   On: 04/23/2015 20:46    Scheduled Meds: . acidophilus  1 capsule Oral Daily  . ciprofloxacin  400 mg Intravenous Q12H  . ferrous sulfate  325 mg Oral TID WC  . methylPREDNISolone (SOLU-MEDROL) injection  60 mg Intravenous Daily  . metronidazole  500 mg Intravenous Q8H  . sodium chloride  3 mL Intravenous Q12H   Continuous Infusions: . sodium chloride 100 mL/hr at 04/25/15 2979    Principal Problem:   Ulcerative colitis, acute Active Problems:   Protein-calorie malnutrition, severe   Anemia   Spencer Williams K  [facility] Pager [phone].  If 7PM-7AM, please contact night-coverage at [email or web address], password [facility] 04/25/2015, 2:46 PM  LOS: 2 days

## 2015-04-25 NOTE — Progress Notes (Signed)
[facility] Progress Note  Subjective: The patient feels much better today. He feels like his ulcerative colitis is improving with IV steroids. His frequency of bowel movements is much less, and he is not seeing much blood now.  Objective: Vital signs in last 24 hours: Temp:  [97.3 F (36.3 C)-97.5 F (36.4 C)] 97.5 F (36.4 C) (08/17 0437) Pulse Rate:  [60-75] 60 (08/17 0437) Resp:  [16-18] 16 (08/17 0437) BP: (90-108)/(34-50) 95/50 mmHg (08/17 0445) SpO2:  [100 %] 100 % (08/17 0437) Weight:  [58.015 kg (127 lb 14.4 oz)] 58.015 kg (127 lb 14.4 oz) (08/17 0636) Weight change: 1.1 kg (2 lb 6.8 oz)   PE:  He is in no distress  Heart regular rhythm  Lungs clear  Abdomen, soft and nontender  Lab Results: Results for orders placed or performed during the hospital encounter of 04/23/15 (from the past 24 hour(s))  C difficile quick scan w PCR reflex     Status: Abnormal   Collection Time: 04/24/15  3:20 PM  Result Value Ref Range   C Diff antigen POSITIVE (A) NEGATIVE   C Diff toxin NEGATIVE NEGATIVE   C Diff interpretation      C. difficile present, but toxin not detected. This indicates colonization. In most cases, this does not require treatment. If patient has signs and symptoms consistent with colitis, consider treatment.  Urinalysis, Routine w reflex microscopic (not at [facility])     Status: Abnormal   Collection Time: 04/24/15  3:30 PM  Result Value Ref Range   Color, Urine YELLOW YELLOW   APPearance CLEAR CLEAR   Specific Gravity, Urine 1.016 1.005 - 1.030   pH 6.5 5.0 - 8.0   Glucose, UA NEGATIVE NEGATIVE mg/dL   Hgb urine dipstick NEGATIVE NEGATIVE   Bilirubin Urine NEGATIVE NEGATIVE   Ketones, ur 15 (A) NEGATIVE mg/dL   Protein, ur NEGATIVE NEGATIVE mg/dL   Urobilinogen, UA 0.2 0.0 - 1.0 mg/dL   Nitrite NEGATIVE NEGATIVE   Leukocytes, UA NEGATIVE NEGATIVE  CBC     Status: Abnormal   Collection Time: 04/25/15  4:20 AM  Result Value Ref Range   WBC 7.2  4.0 - 10.5 K/uL   RBC 3.65 (L) 4.22 - 5.81 MIL/uL   Hemoglobin 8.7 (L) 13.0 - 17.0 g/dL   HCT 28.4 (L) 39.0 - 52.0 %   MCV 77.8 (L) 78.0 - 100.0 fL   MCH 23.8 (L) 26.0 - 34.0 pg   MCHC 30.6 30.0 - 36.0 g/dL   RDW 14.0 11.5 - 15.5 %   Platelets 335 150 - 400 K/uL    Studies/Results: No results found.    Assessment: Ulcerative colitis with flareup. Patient is clinically improving with IV steroids.  Plan:   I would recommend keeping him on IV steroids another day. If he is still doing well tomorrow switch him over to oral prednisone at a dose of 40 mg daily and he can probably go home provided that he is eating and doing well otherwise. He can follow-up with Dr. Paulita Fujita as an outpatient.    SAM F GANEM 04/25/2015, 11:40 AM  Pager: [phone] If no answer or after 5 PM call [phone]

## 2015-04-26 DIAGNOSIS — Z229 Carrier of infectious disease, unspecified: Secondary | ICD-10-CM

## 2015-04-26 LAB — CBC
HEMATOCRIT: 29.7 % — AB (ref 39.0–52.0)
Hemoglobin: 9.1 g/dL — ABNORMAL LOW (ref 13.0–17.0)
MCH: 23.8 pg — AB (ref 26.0–34.0)
MCHC: 30.6 g/dL (ref 30.0–36.0)
MCV: 77.7 fL — AB (ref 78.0–100.0)
PLATELETS: 326 10*3/uL (ref 150–400)
RBC: 3.82 MIL/uL — ABNORMAL LOW (ref 4.22–5.81)
RDW: 14 % (ref 11.5–15.5)
WBC: 6.9 10*3/uL (ref 4.0–10.5)

## 2015-04-26 MED ORDER — METRONIDAZOLE 500 MG PO TABS: 500.0000 mg | ORAL_TABLET | Freq: Three times a day (TID) | ORAL | Status: DC

## 2015-04-26 MED ORDER — RISAQUAD PO CAPS: 1.0000 | ORAL_CAPSULE | Freq: Every day | ORAL | Status: AC

## 2015-04-26 MED ORDER — PREDNISONE 5 MG PO TABS: 40.0000 mg | ORAL_TABLET | Freq: Every day | ORAL | Status: DC

## 2015-04-26 NOTE — Discharge Summary (Signed)
Physician Discharge Summary  Spencer Williams GPQ:[id number] DOB: 09/16/1995 DOA: 04/23/2015  PCP: Lorayne Marek, MD  Admit date: 04/23/2015 Discharge date: 04/26/2015  Time spent: 20 minutes  Recommendations for Outpatient Follow-up:  1. Follow up with PCP in 1-2 weeks 2. Follow up with Dr. Paulita Fujita in 2-3 weeks 3. Taper prednisone per GI  Discharge Diagnoses:  Principal Problem:   Ulcerative colitis, acute Active Problems:   Protein-calorie malnutrition, severe   Anemia   Colonized with infectious organism   Discharge Condition: Improved  Diet recommendation: Regular  Filed Weights   04/23/15 2200 04/24/15 1030 04/25/15 0636  Weight: 55.6 kg (122 lb 9.2 oz) 56.7 kg (125 lb) 58.015 kg (127 lb 14.4 oz)    History of present illness:  Please review h and p from 8/15 for details. Briefly, pt presented with multiple bouts of bright red blood per rectum in the setting of known ulcerative colitis. The patient was admitted for further work up.  Hospital Course:  1. Ulcerative Colitis 1. GI was consulted and had been following 2. Pt was continued on solumedrol with cipro/flagyl 3. Pt reported improvement in symptoms 4. Hgb remained stable 5. Patient was ultimately cleared for discharge with recs to continue on daily prednisone at 72m daily, taper dose to be deferred to pt's primary Gastroenterologist, Dr. OPaulita Fujita2. 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 Stools from initial presentation was found to be pos for Cdiff antigen, neg for Cdiff toxin suggesting colonization 2. Recommendations for 10 days of flagyl on discharge 3. Anemia 1. hgb remains stable overnight 2. Cont to monitor 4. Protein calorie malnutrition, severe 1. Nutrition following 5. DVT prophylaxis 1. Cont on SCD's while inpatient  Consultations:  GI  Discharge Exam: Filed Vitals:   04/25/15 0445 04/25/15 0636 04/25/15 1444 04/25/15 2043  BP: 95/50  115/58 115/51  Pulse:   82 78  Temp:   98.1 F (36.7  C) 98.4 F (36.9 C)  TempSrc:   Oral Oral  Resp:   16 16  Height:      Weight:  58.015 kg (127 lb 14.4 oz)    SpO2:   99% 100%    General: Awake, in nad Cardiovascular: regular, s1, s2 Respiratory: normal resp effort, no wheezing  Discharge Instructions     Medication List    STOP taking these medications        clonazePAM 0.5 MG tablet  Commonly known as:  KLONOPIN     FLUoxetine 20 MG tablet  Commonly known as:  PROZAC     mesalamine 1.2 G EC tablet  Commonly known as:  LIALDA     oxyCODONE 5 MG immediate release tablet  Commonly known as:  Oxy IR/ROXICODONE     promethazine 25 MG tablet  Commonly known as:  PHENERGAN     simethicone 80 MG chewable tablet  Commonly known as:  MYLICON      TAKE these medications        acetaminophen 500 MG tablet  Commonly known as:  TYLENOL  Take 1,000 mg by mouth every 6 (six) hours as needed for mild pain, moderate pain or headache.     BIOTIN MAXIMUM STRENGTH 10 MG Tabs  Generic drug:  Biotin  Take 1 tablet by mouth daily.     Budesonide 9 MG Tb24  Take 1 tablet by mouth daily.     feeding supplement Liqd  Take 1 Container by mouth 3 (three) times daily between meals.     ferrous sulfate 325 (  65 FE) MG tablet  Commonly known as:  FERROUSUL  Take 1 tablet (325 mg total) by mouth 3 (three) times daily with meals.     HAIR/SKIN/NAILS PO  Take 1 tablet by mouth 2 (two) times daily.     metroNIDAZOLE 500 MG tablet  Commonly known as:  FLAGYL  Take 1 tablet (500 mg total) by mouth 3 (three) times daily.     ondansetron 4 MG disintegrating tablet  Commonly known as:  ZOFRAN ODT  Take 1 tablet (4 mg total) by mouth every 6 (six) hours as needed for nausea or vomiting.     predniSONE 5 MG tablet  Commonly known as:  DELTASONE  Take 8 tablets (40 mg total) by mouth daily with breakfast.     PROBIOTIC DAILY PO  Take 1 tablet by mouth daily.     acidophilus Caps capsule  Take 1 capsule by mouth daily.      SIMPONI [place]  Inject 1 Dose into the skin every 30 (thirty) days.       No Known Allergies Follow-up Information    Follow up with [name], MD. Schedule an appointment as soon as possible for a visit in 2 weeks.   Specialty:  Gastroenterology   Why:  Hospital follow up   Contact information:   [address] [place] [address] [phone]       Follow up with Follow up with your PCP in 1-2 weeks.   Why:  Hospital follow up       The results of significant diagnostics from this hospitalization (including imaging, microbiology, ancillary and laboratory) are listed below for reference.    Significant Diagnostic Studies: Dg Abd 2 Views  04/23/2015   CLINICAL DATA:  The patient is presenting with complaints of multiple episodes of diarrhea with bright red blood per rectum.The patient mentions that 3 weeks ago he started having increasing episodes of diarrhea with few episodes of bright red blood per rectum. He was placed on prednisone regimen without any significant benefit. He was later on switched to budesonide which she is taking currently but since last one week he continues to have more than 10 bowel movements in a day with bright red blood.He also started having increasing pain in the lower abdominal area.Denies any nausea or vomiting and denies any chest pain shortness of breath cough or bleeding anywhere else or burning urination.He mentions he feels fatigue and tired and has lack of sleep.He also has lack of appetite.He has lost some weight. Hx ulcerative colitis.  EXAM: ABDOMEN - 2 VIEW  COMPARISON:  11/27/2014  FINDINGS: There is no bowel dilation to suggest obstruction. On the erect view, there are nonspecific air-fluid levels within normal caliber small bowel. This is nonspecific. It could reflect a mild localized adynamic ileus or possibly gastroenteritis.  Mild increased stool burden in the right colon.  Soft tissues are unremarkable.  No free air.  No  skeletal abnormality.  IMPRESSION: 1. No evidence of obstruction or free intraperitoneal air. 2. Few air-fluid levels within the small bowel without bowel dilation. Mild adynamic ileus is suspected. 3. Mild increased stool burden in the right colon.   Electronically Signed   By: Lajean Manes M.D.   On: 04/23/2015 20:46    Microbiology: Recent Results (from the past 240 hour(s))  MRSA PCR Screening     Status: None   Collection Time: 04/23/15 10:44 PM  Result Value Ref Range Status   MRSA by PCR NEGATIVE  NEGATIVE Final    Comment:        The GeneXpert MRSA Assay (FDA approved for NASAL specimens only), is one component of a comprehensive MRSA colonization surveillance program. It is not intended to diagnose MRSA infection nor to guide or monitor treatment for MRSA infections.   C difficile quick scan w PCR reflex     Status: Abnormal   Collection Time: 04/24/15  3:20 PM  Result Value Ref Range Status   C Diff antigen POSITIVE (A) NEGATIVE Final    Comment: CRITICAL RESULT CALLED TO, READ BACK BY AND VERIFIED WITH: Gwenevere Abbot [id number] @ 1287 BY J SCOTTON    C Diff toxin NEGATIVE NEGATIVE Final   C Diff interpretation   Final    C. difficile present, but toxin not detected. This indicates colonization. In most cases, this does not require treatment. If patient has signs and symptoms consistent with colitis, consider treatment.     Labs: Basic Metabolic Panel:  Recent Labs Lab 04/23/15 1753 04/24/15 0640  NA 137 139  K 4.2 4.9  CL 103 106  CO2 27 27  GLUCOSE 90 129*  BUN 9 8  CREATININE 0.90 0.86  CALCIUM 9.0 8.6*   Liver Function Tests:  Recent Labs Lab 04/23/15 1753 04/24/15 0640  AST 13* 11*  ALT 10* 9*  ALKPHOS 41 37*  BILITOT 0.2* 0.4  PROT 6.8 6.0*  ALBUMIN 3.0* 2.8*   No results for input(s): LIPASE, AMYLASE in the last 168 hours. No results for input(s): AMMONIA in the last 168 hours. CBC:  Recent Labs Lab 04/23/15 1753 04/24/15 0030  04/24/15 0640 04/25/15 0420 04/26/15 0505  WBC 7.6 8.4 4.9 7.2 6.9  HGB 10.6* 10.2* 10.3* 8.7* 9.1*  HCT 33.6* 32.4* 32.5* 28.4* 29.7*  MCV 78.3 78.1 78.9 77.8* 77.7*  PLT 443* 391 412* 335 326   Cardiac Enzymes: No results for input(s): CKTOTAL, CKMB, CKMBINDEX, TROPONINI in the last 168 hours. BNP: BNP (last 3 results) No results for input(s): BNP in the last 8760 hours.  ProBNP (last 3 results) No results for input(s): PROBNP in the last 8760 hours.  CBG: No results for input(s): GLUCAP in the last 168 hours.   Signed:  CHIU, STEPHEN K  [facility] 04/26/2015, 7:14 PM

## 2015-04-26 NOTE — Progress Notes (Signed)
[facility] Progress Note  Subjective: No abdominal pain, diarrhea or rectal bleeding. Eating ok.  Objective: Vital signs in last 24 hours: Temp:  [98.1 F (36.7 C)-98.4 F (36.9 C)] 98.4 F (36.9 C) (08/17 2043) Pulse Rate:  [78-82] 78 (08/17 2043) Resp:  [16] 16 (08/17 2043) BP: (115)/(51-58) 115/51 mmHg (08/17 2043) SpO2:  [99 %-100 %] 100 % (08/17 2043) Weight change:    PE: No distress  Heart RRR Lungs clear Abdomen soft and non tender  Lab Results: Results for orders placed or performed during the hospital encounter of 04/23/15 (from the past 24 hour(s))  CBC     Status: Abnormal   Collection Time: 04/26/15  5:05 AM  Result Value Ref Range   WBC 6.9 4.0 - 10.5 K/uL   RBC 3.82 (L) 4.22 - 5.81 MIL/uL   Hemoglobin 9.1 (L) 13.0 - 17.0 g/dL   HCT 29.7 (L) 39.0 - 52.0 %   MCV 77.7 (L) 78.0 - 100.0 fL   MCH 23.8 (L) 26.0 - 34.0 pg   MCHC 30.6 30.0 - 36.0 g/dL   RDW 14.0 11.5 - 15.5 %   Platelets 326 150 - 400 K/uL    Studies/Results: No results found.    Assessment: Ulcerative colitis with flare. Improved.  Plan:   OK with me to send him home and follow up with Dr Paulita Fujita in a couple of weeks. Would send home on Prednisone 77m daily and it can be tapered by Dr OPaulita Fujita Also would treat with Flagyl 5081mtid for 10 days since he is colonized with C dif.     SACassell Clement/18/2016, 8:42 AM  Pager: [phone] no answer or after 5 PM call [phone]

## 2015-04-26 NOTE — Progress Notes (Signed)
Patient d/c home,stable.Denies pain. D/c instructions rendered,verbalized understanding,prescriptions given. In good spirits.

## 2015-10-17 IMAGING — CT CT ABD-PELV W/ CM
1 of 2 series · 15 of 32 positions shown, 19 images · IV contrast (omnipaque)
Comparison: October 23, 2014

CLINICAL DATA: Abdominal pain with nausea and vomiting

EXAM:
CT ABDOMEN AND PELVIS WITH CONTRAST
TECHNIQUE: Multidetector CT imaging of the abdomen and pelvis was performed
using the standard protocol following bolus administration of
intravenous contrast. Oral contrast was also administered.
CONTRAST:  100mL OMNIPAQUE IOHEXOL 300 MG/ML  SOLN

[Series 2: abd/pel with · axial · 0.70mm/px · z∈[+894,+1324]mm · 15 of 94 slices shown, 19 images]
[im 4/94  soft-tissue]
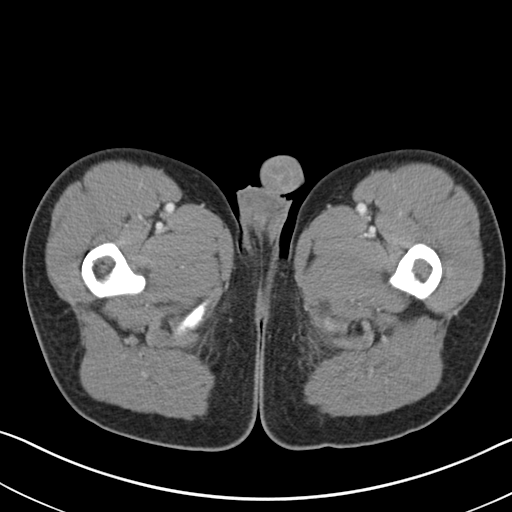
[im 4/94  bone]
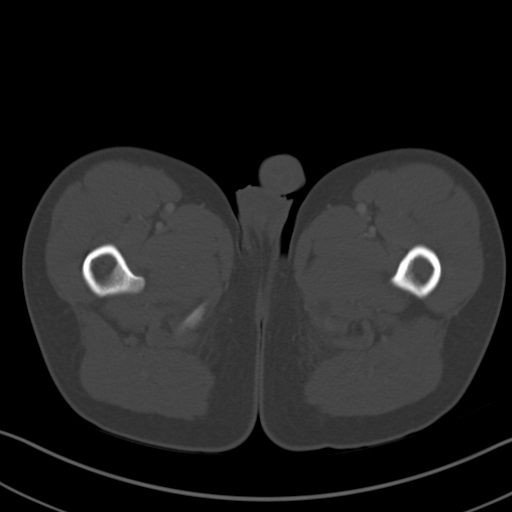
[im 12/94  soft-tissue]
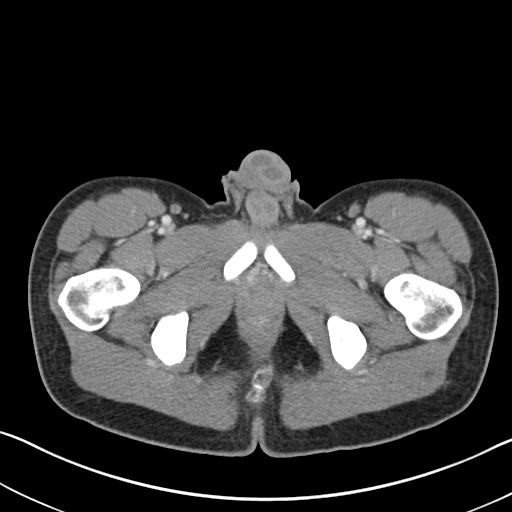
[im 20/94  soft-tissue]
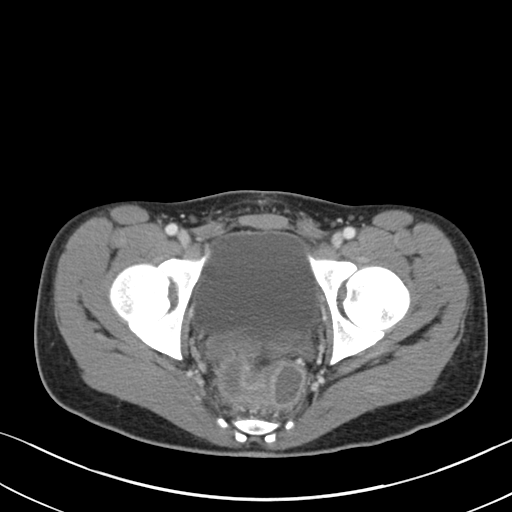
[im 28/94  soft-tissue]
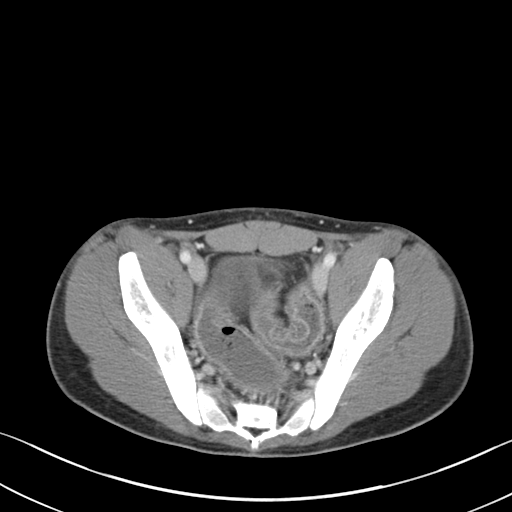
[im 32/94  soft-tissue]
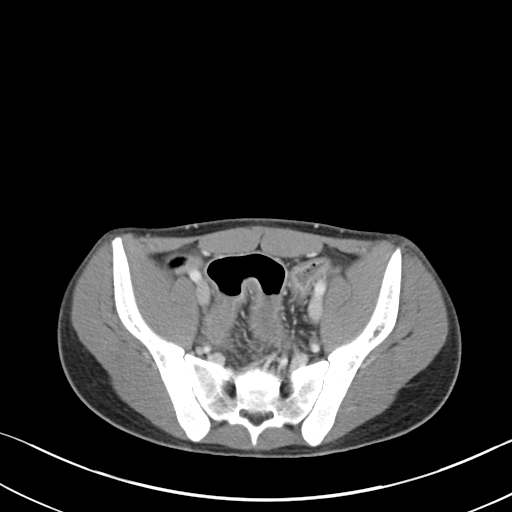
[im 39/94  soft-tissue]
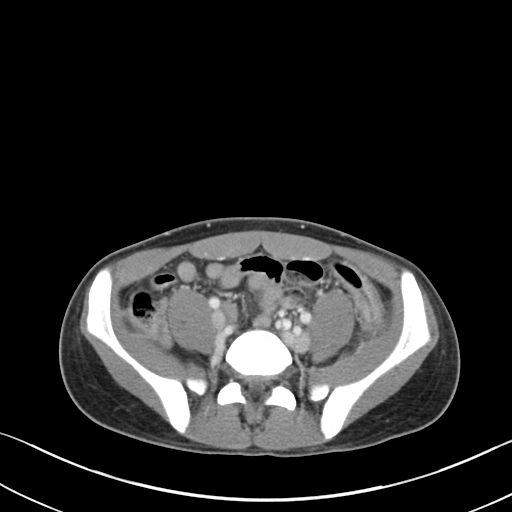
[im 47/94  soft-tissue]
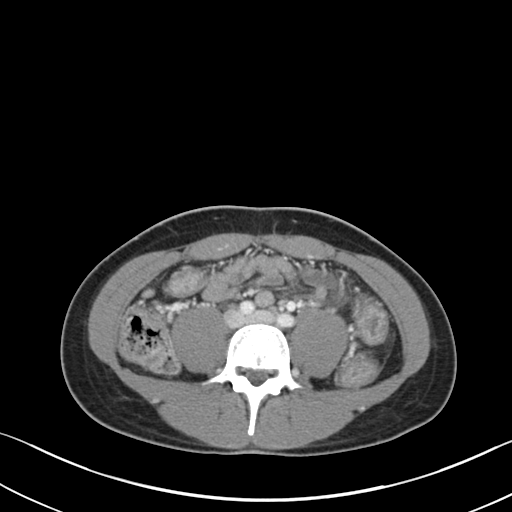
[im 55/94  soft-tissue]
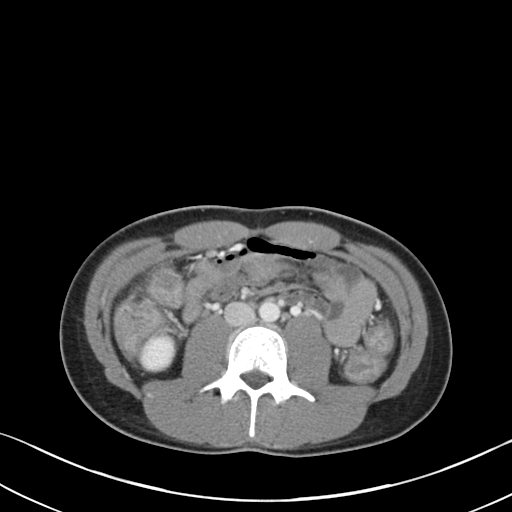
[im 63/94  soft-tissue]
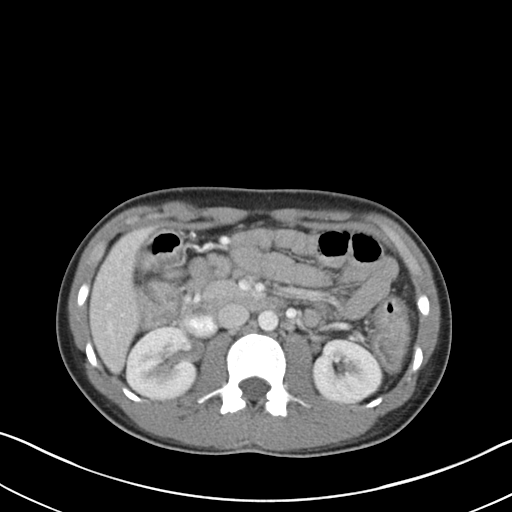
[im 63/94  bone]
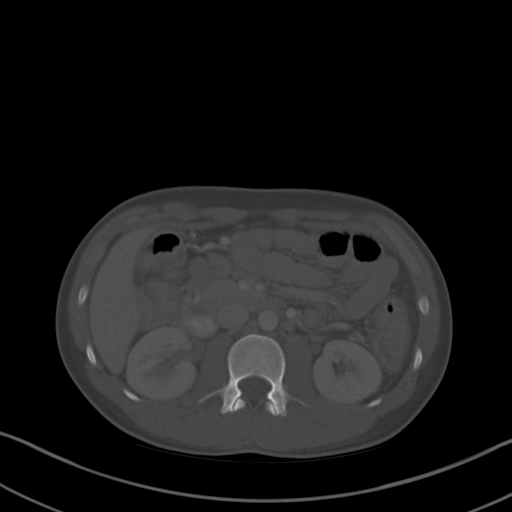
[im 66/94  soft-tissue]
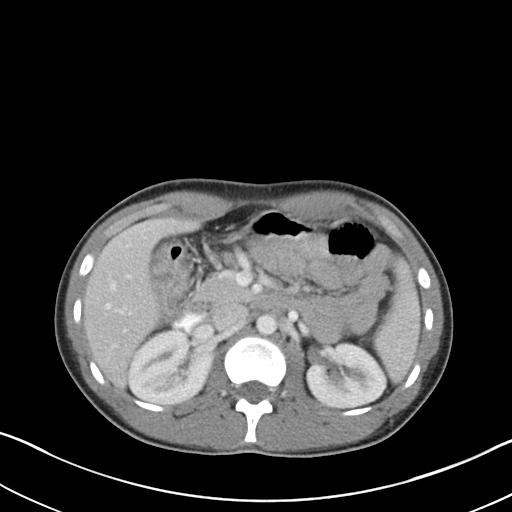
[im 74/94  soft-tissue]
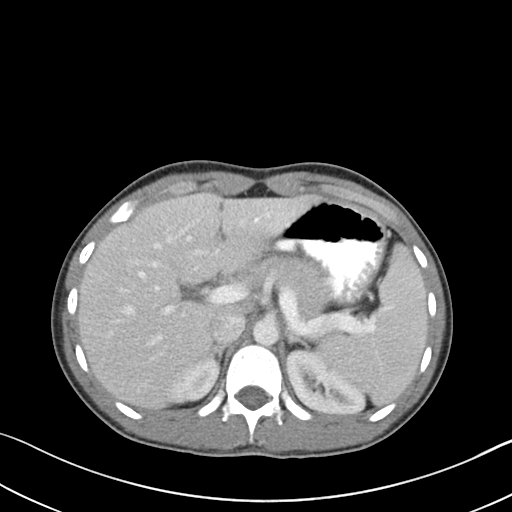
[im 78/94  lung]
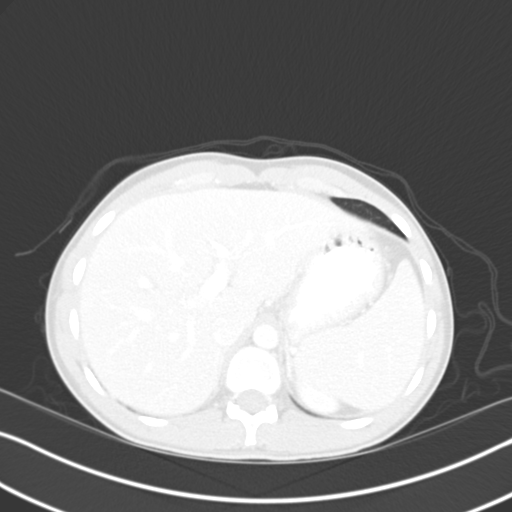
[im 82/94  soft-tissue]
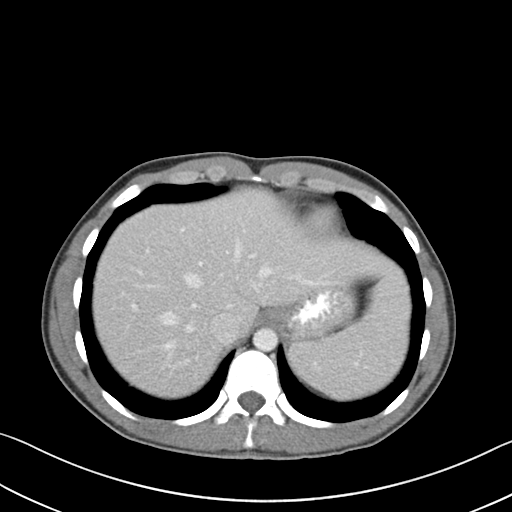
[im 82/94  lung]
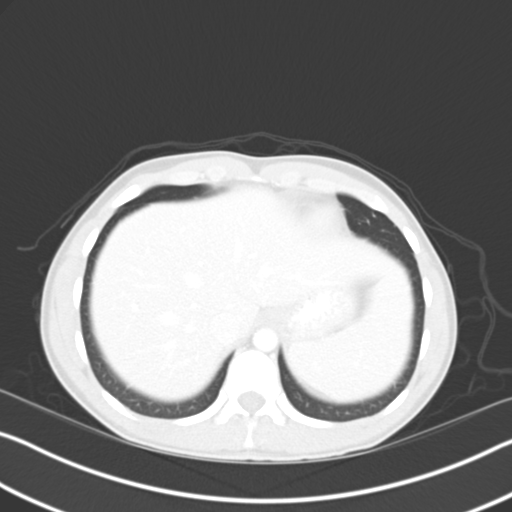
[im 86/94  lung]
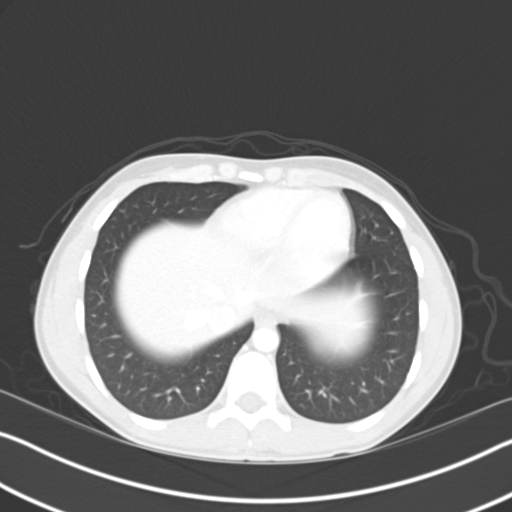
[im 90/94  soft-tissue]
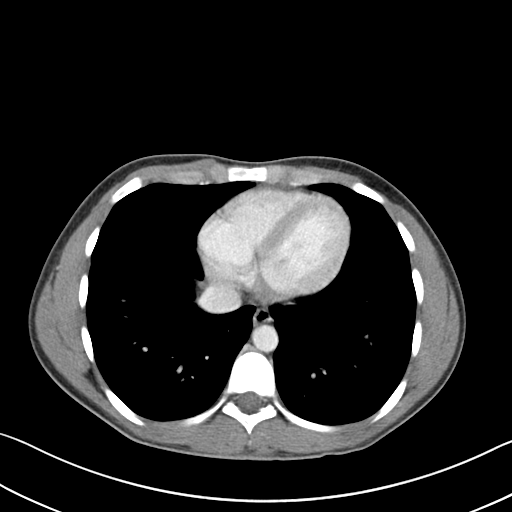
[im 90/94  lung]
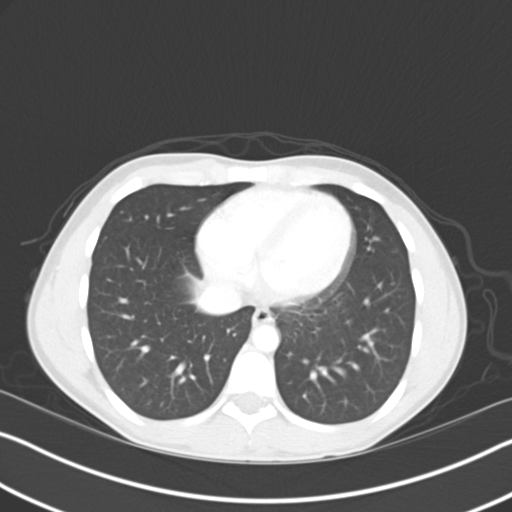

[15 of 32 positions shown; findings below may reference images not displayed]

FINDINGS: Lung bases are clear.

No focal liver lesions are identified. The gallbladder wall is not
appreciably thickened. There is no biliary duct dilatation.

Spleen, pancreas, and adrenals appear normal. Kidneys bilaterally
show no mass or hydronephrosis. There is no renal or ureteral
calculus on either side.

In the pelvis, the urinary bladder is midline with normal wall
thickness. There is no pelvic mass or pelvic fluid collection.

There is wall thickening throughout much of the colon with hyperemia
in the wall. This finding involves virtually all of the colon and
upper rectum and is more extensive than on recent prior study.

There is no bowel obstruction. No free air or portal venous air. The
appendix appears normal.

There is no ascites, adenopathy, or abscess in the abdomen or
pelvis. There is no evidence of abdominal aortic aneurysm. There are
no blastic or lytic bone lesions.
IMPRESSION: Diffuse colitis, most likely inflammatory or infectious etiology.
There has been overall progression of colitis compared to the
previous study with a more generalized pattern of colitis compared
to the previous study. There is no evidence of bowel obstruction or
free air. Appendix appears normal. No small bowel abnormality
appreciable on this study. Study otherwise unremarkable.

## 2015-10-24 ENCOUNTER — Other Ambulatory Visit: Payer: Self-pay | Admitting: Gastroenterology

## 2015-10-24 ENCOUNTER — Ambulatory Visit
Admission: RE | Admit: 2015-10-24 | Discharge: 2015-10-24 | Disposition: A | Discharge status: Home / Self Care | Payer: Medicaid Other | Source: Ambulatory Visit | Attending: Gastroenterology | Admitting: Gastroenterology

## 2015-10-24 DIAGNOSIS — R10819 Abdominal tenderness, unspecified site: Secondary | ICD-10-CM

## 2015-11-06 ENCOUNTER — Other Ambulatory Visit (HOSPITAL_COMMUNITY): Payer: Self-pay | Admitting: *Deleted

## 2015-11-07 ENCOUNTER — Ambulatory Visit (HOSPITAL_COMMUNITY)
Admission: RE | Admit: 2015-11-07 | Discharge: 2015-11-07 | Disposition: A | Discharge status: Home / Self Care | Payer: Medicaid Other | Source: Ambulatory Visit | Attending: Gastroenterology | Admitting: Gastroenterology

## 2015-11-07 DIAGNOSIS — K5190 Ulcerative colitis, unspecified, without complications: Secondary | ICD-10-CM | POA: Diagnosis not present

## 2015-11-07 MED ORDER — ACETAMINOPHEN 325 MG PO TABS: 650.0000 mg | ORAL_TABLET | ORAL | Status: DC

## 2015-11-07 MED ORDER — DIPHENHYDRAMINE HCL 25 MG PO CAPS
25.0000 mg | ORAL_CAPSULE | ORAL | Status: DC
  Administered 2015-11-07: 25 mg via ORAL

## 2015-11-07 MED ORDER — INFLIXIMAB 100 MG IV SOLR
5.0000 mg/kg | INTRAVENOUS | Status: DC
  Administered 2015-11-07: 400 mg via INTRAVENOUS
  Filled 2015-11-07: qty 40

## 2015-11-07 MED ORDER — DIPHENHYDRAMINE HCL 25 MG PO CAPS
ORAL_CAPSULE | ORAL | Status: AC
  Filled 2015-11-07: qty 1

## 2015-11-07 MED ORDER — ACETAMINOPHEN 325 MG PO TABS
ORAL_TABLET | ORAL | Status: AC
  Administered 2015-11-07: 325 mg
  Filled 2015-11-07: qty 1

## 2015-11-07 MED ORDER — SODIUM CHLORIDE 0.9 % IV SOLN
INTRAVENOUS | Status: DC
  Administered 2015-11-07: 09:00:00 via INTRAVENOUS

## 2015-11-21 ENCOUNTER — Ambulatory Visit (HOSPITAL_COMMUNITY)
Admission: RE | Admit: 2015-11-21 | Discharge: 2015-11-21 | Disposition: A | Discharge status: Home / Self Care | Payer: Medicaid Other | Source: Ambulatory Visit | Attending: Gastroenterology | Admitting: Gastroenterology

## 2015-11-21 DIAGNOSIS — K5190 Ulcerative colitis, unspecified, without complications: Secondary | ICD-10-CM | POA: Insufficient documentation

## 2015-11-21 DIAGNOSIS — Z79899 Other long term (current) drug therapy: Secondary | ICD-10-CM | POA: Diagnosis not present

## 2015-11-21 MED ORDER — ACETAMINOPHEN 325 MG PO TABS
650.0000 mg | ORAL_TABLET | ORAL | Status: DC
  Administered 2015-11-21: 325 mg via ORAL

## 2015-11-21 MED ORDER — DIPHENHYDRAMINE HCL 25 MG PO CAPS: 25.0000 mg | ORAL_CAPSULE | ORAL | Status: DC

## 2015-11-21 MED ORDER — ACETAMINOPHEN 325 MG PO TABS: 650.0000 mg | ORAL_TABLET | ORAL | Status: DC

## 2015-11-21 MED ORDER — DIPHENHYDRAMINE HCL 25 MG PO CAPS
25.0000 mg | ORAL_CAPSULE | ORAL | Status: DC
  Administered 2015-11-21: 25 mg via ORAL

## 2015-11-21 MED ORDER — SODIUM CHLORIDE 0.9 % IV SOLN
INTRAVENOUS | Status: DC
  Administered 2015-11-21: 09:00:00 via INTRAVENOUS

## 2015-11-21 MED ORDER — SODIUM CHLORIDE 0.9 % IV SOLN
400.0000 mg | Freq: Once | INTRAVENOUS | Status: AC
  Administered 2015-11-21: 400 mg via INTRAVENOUS
  Filled 2015-11-21: qty 40

## 2015-11-21 MED ORDER — SODIUM CHLORIDE 0.9 % IV SOLN: 5.0000 mg/kg | INTRAVENOUS | Status: DC

## 2015-12-19 ENCOUNTER — Ambulatory Visit (HOSPITAL_COMMUNITY)
Admission: RE | Admit: 2015-12-19 | Discharge: 2015-12-19 | Disposition: A | Discharge status: Home / Self Care | Payer: Medicaid Other | Source: Ambulatory Visit | Attending: Gastroenterology | Admitting: Gastroenterology

## 2015-12-19 DIAGNOSIS — K5190 Ulcerative colitis, unspecified, without complications: Secondary | ICD-10-CM | POA: Insufficient documentation

## 2015-12-19 MED ORDER — ACETAMINOPHEN 325 MG PO TABS
325.0000 mg | ORAL_TABLET | Freq: Once | ORAL | Status: AC
  Administered 2015-12-19: 325 mg via ORAL

## 2015-12-19 MED ORDER — DIPHENHYDRAMINE HCL 25 MG PO CAPS
25.0000 mg | ORAL_CAPSULE | Freq: Once | ORAL | Status: AC
  Administered 2015-12-19: 25 mg via ORAL

## 2015-12-19 MED ORDER — ACETAMINOPHEN 325 MG PO TABS: 650.0000 mg | ORAL_TABLET | Freq: Once | ORAL | Status: DC

## 2015-12-19 MED ORDER — SODIUM CHLORIDE 0.9 % IV SOLN
INTRAVENOUS | Status: DC
  Administered 2015-12-19: 08:00:00 via INTRAVENOUS

## 2015-12-19 MED ORDER — ACETAMINOPHEN 325 MG PO TABS
ORAL_TABLET | ORAL | Status: AC
  Filled 2015-12-19: qty 1

## 2015-12-19 MED ORDER — DIPHENHYDRAMINE HCL 25 MG PO CAPS
ORAL_CAPSULE | ORAL | Status: AC
  Administered 2015-12-19: 25 mg via ORAL
  Filled 2015-12-19: qty 1

## 2015-12-19 MED ORDER — INFLIXIMAB 100 MG IV SOLR
400.0000 mg | Freq: Once | INTRAVENOUS | Status: AC
  Administered 2015-12-19: 400 mg via INTRAVENOUS
  Filled 2015-12-19: qty 40

## 2015-12-26 ENCOUNTER — Encounter (HOSPITAL_COMMUNITY): Payer: Self-pay | Admitting: *Deleted

## 2015-12-26 ENCOUNTER — Emergency Department (HOSPITAL_COMMUNITY)
Admission: EM | Admit: 2015-12-26 | Discharge: 2015-12-26 | Disposition: A | Discharge status: Home / Self Care | Payer: Medicaid Other | Source: Home / Self Care

## 2015-12-26 ENCOUNTER — Emergency Department (HOSPITAL_COMMUNITY)
Admission: EM | Admit: 2015-12-26 | Discharge: 2015-12-26 | Disposition: A | Discharge status: Home / Self Care | Payer: Medicaid Other | Attending: Emergency Medicine | Admitting: Emergency Medicine

## 2015-12-26 DIAGNOSIS — R0981 Nasal congestion: Secondary | ICD-10-CM

## 2015-12-26 DIAGNOSIS — G43909 Migraine, unspecified, not intractable, without status migrainosus: Secondary | ICD-10-CM | POA: Diagnosis present

## 2015-12-26 DIAGNOSIS — Z79899 Other long term (current) drug therapy: Secondary | ICD-10-CM | POA: Insufficient documentation

## 2015-12-26 DIAGNOSIS — R51 Headache: Secondary | ICD-10-CM | POA: Diagnosis not present

## 2015-12-26 DIAGNOSIS — Z5321 Procedure and treatment not carried out due to patient leaving prior to being seen by health care provider: Secondary | ICD-10-CM | POA: Insufficient documentation

## 2015-12-26 HISTORY — DX: Migraine, unspecified, not intractable, without status migrainosus: G43.909

## 2015-12-26 NOTE — ED Notes (Signed)
Pt breathing is unlabored, upon assessment pt began to speed up his breathing.

## 2015-12-26 NOTE — ED Notes (Signed)
Pt states that he began having a migraine around 3pm; pt states that he has pain to the back left side of head; pt c/o nausea and sensitivity to light; pt states that he has a hx of migraines

## 2015-12-26 NOTE — ED Notes (Addendum)
Pt kicking foot up and down on the bed during assessment. Pt lung sounds clear and O2 sat at 100%. This nurse explained to the patient that the provider will be in as soon as he/ she is able.

## 2015-12-26 NOTE — ED Notes (Signed)
Pt ambulated out of the ED without being seen.

## 2015-12-26 NOTE — ED Notes (Signed)
Pt pacing in his room, staring out the window and refusing to sit in the stretcher.

## 2015-12-26 NOTE — ED Notes (Signed)
Pt has begun to sniff his nose loudly and is now complaining of nasal congestion.

## 2015-12-26 NOTE — ED Notes (Signed)
Advised pt to stay to be seen by provider. Pt informed RN he needs to leave and go to school. Pt adamant that he must go.

## 2015-12-26 NOTE — ED Notes (Signed)
Pt walks to the nursing desk and states "I think I am having trouble breathing now."

## 2015-12-26 NOTE — ED Notes (Signed)
C/o headache onset 3 pm yest states his medications arent working

## 2016-01-02 ENCOUNTER — Encounter: Payer: Self-pay | Admitting: Neurology

## 2016-01-02 ENCOUNTER — Ambulatory Visit (INDEPENDENT_AMBULATORY_CARE_PROVIDER_SITE_OTHER): Payer: Medicaid Other | Admitting: Neurology

## 2016-01-02 VITALS — BP 111/66 | HR 73 | Ht 67.0 in | Wt 168.5 lb

## 2016-01-02 DIAGNOSIS — IMO0002 Reserved for concepts with insufficient information to code with codable children: Secondary | ICD-10-CM

## 2016-01-02 DIAGNOSIS — G43709 Chronic migraine without aura, not intractable, without status migrainosus: Secondary | ICD-10-CM | POA: Diagnosis not present

## 2016-01-02 MED ORDER — PROMETHAZINE HCL 25 MG PO TABS: 25.0000 mg | ORAL_TABLET | Freq: Four times a day (QID) | ORAL | Status: AC | PRN

## 2016-01-02 NOTE — Progress Notes (Signed)
PATIENT: Spencer Williams DOB: [birth year]  Chief Complaint  Patient presents with  . Headache    He is here with his mother.  He has had at least one migraine weekly for the last two months.  He has tried NSAIDS without relief.  He was recently given sumatriptan by his PCP but has not tried it yet.  He has never tried preventive medications.     HISTORICAL  Spencer Williams 20 years old right-handed male, accompanied by his mother, seen in refer by his primary care physician Dr. Lorayne Williams for evaluation of migraine headaches in January 02 2016  He had a history of ulcerative colitis since 2016, presented with bloody diarrhea, fatigue, now is under good control with Remicade IV infusion every 2 months.  He began to experience frequent headaches since February 2017, he complains of bilateral holocranial headaches, throbbing, with associated light noise sensitivity, nauseous, lasting few hours to half day, she has tried over-the-counter Tylenol, did not help, he could not take NSAIDs and aspirin because of ulcerative colitis, sometimes he has to go to emergency room urgent care for Toradol shots, which has always been helpful.  He denies focal signs, no visual loss, no family history of migraine, over the past few weeks, he has been taking Claritin for his allergic symptoms, his headache overall has improved, he has average 1 severe headache each week.  REVIEW OF SYSTEMS: Full 14 system review of systems performed and notable only for Shortness of breath, flushing, allergy, runny nose, headaches  ALLERGIES: No Known Allergies  HOME MEDICATIONS: Current Outpatient Prescriptions  Medication Sig Dispense Refill  . acetaminophen (TYLENOL) 500 MG tablet Take 1,000 mg by mouth every 6 (six) hours as needed for mild pain, moderate pain or headache.    Marland Kitchen acidophilus (RISAQUAD) CAPS capsule Take 1 capsule by mouth daily. 30 capsule 0  . Biotin (BIOTIN MAXIMUM STRENGTH) 10 MG TABS Take 1 tablet by  mouth daily.    . feeding supplement, RESOURCE BREEZE, (RESOURCE BREEZE) LIQD Take 1 Container by mouth 3 (three) times daily between meals. 237 mL 0  . ferrous sulfate (FERROUSUL) 325 (65 FE) MG tablet Take 1 tablet (325 mg total) by mouth 3 (three) times daily with meals. 90 tablet 3  . Multiple Vitamins-Minerals (HAIR/SKIN/NAILS PO) Take 1 tablet by mouth 2 (two) times daily.    . Omega-3 Fatty Acids (FISH OIL) 1000 MG CPDR Take 1,000 mg by mouth daily.    . Probiotic Product (PROBIOTIC DAILY PO) Take 1 tablet by mouth daily.    . SUMAtriptan (IMITREX) 25 MG tablet TK 1 T PO AT ONCET OF HEADACHE. MAY REPAT IN 2 HOURS. UP TO 4 TABLETS PER 24 HOURS.  0  . Zinc 50 MG TABS Take 50 mg by mouth daily.     No current facility-administered medications for this visit.    PAST MEDICAL HISTORY: Past Medical History  Diagnosis Date  . Ulcerative colitis ([place])   . Migraines     PAST SURGICAL HISTORY: Past Surgical History  Procedure Laterality Date  . Flexible sigmoidoscopy N/A 11/08/2014    Procedure: FLEXIBLE SIGMOIDOSCOPY;  Surgeon: Spencer Columbia, MD;  Location: [facility];  Service: Endoscopy;  Laterality: N/A;    FAMILY HISTORY: Not significant  SOCIAL HISTORY:  Social History   Social History  . Marital Status: Single    Spouse Name: N/A  . Number of Children: 0  . Years of Education: College   Occupational History  . Student  Social History Main Topics  . Smoking status: Never Smoker   . Smokeless tobacco: Never Used  . Alcohol Use: No  . Drug Use: No  . Sexual Activity: Not Currently   Other Topics Concern  . Not on file   Social History Narrative   Lives at home with mother and brother.   Right-handed.   No caffeine use.     PHYSICAL EXAM   Filed Vitals:   01/02/16 1615  BP: 111/66  Pulse: 73  Height: 5' 7"  (1.702 m)  Weight: 168 lb 8 oz (76.431 kg)    Not recorded      Body mass index is 26.38 kg/(m^2).  PHYSICAL EXAMNIATION:  Gen: NAD,  conversant, well nourised, obese, well groomed                     Cardiovascular: Regular rate rhythm, no peripheral edema, warm, nontender. Eyes: Conjunctivae clear without exudates or hemorrhage Neck: Supple, no carotid bruise. Pulmonary: Clear to auscultation bilaterally   NEUROLOGICAL EXAM:  MENTAL STATUS: Speech:    Speech is normal; fluent and spontaneous with normal comprehension.  Cognition:     Orientation to time, place and person     Normal recent and remote memory     Normal Attention span and concentration     Normal Language, naming, repeating,spontaneous speech     Fund of knowledge   CRANIAL NERVES: CN II: Visual fields are full to confrontation. Fundoscopic exam is normal with sharp discs and no vascular changes. Pupils are round equal and briskly reactive to light. CN III, IV, VI: extraocular movement are normal. No ptosis. CN V: Facial sensation is intact to pinprick in all 3 divisions bilaterally. Corneal responses are intact.  CN VII: Face is symmetric with normal eye closure and smile. CN VIII: Hearing is normal to rubbing fingers CN IX, X: Palate elevates symmetrically. Phonation is normal. CN XI: Head turning and shoulder shrug are intact CN XII: Tongue is midline with normal movements and no atrophy.  MOTOR: There is no pronator drift of out-stretched arms. Muscle bulk and tone are normal. Muscle strength is normal.  REFLEXES: Reflexes are 2+ and symmetric at the biceps, triceps, knees, and ankles. Plantar responses are flexor.  SENSORY: Intact to light touch, pinprick, positional sensation and vibratory sensation are intact in fingers and toes.  COORDINATION: Rapid alternating movements and fine finger movements are intact. There is no dysmetria on finger-to-nose and heel-knee-shin.    GAIT/STANCE: Posture is normal. Gait is steady with normal steps, base, arm swing, and turning. Heel and toe walking are normal. Tandem gait is normal.  Romberg is  absent.   DIAGNOSTIC DATA (LABS, IMAGING, TESTING) - I reviewed patient records, labs, notes, testing and imaging myself where available.   ASSESSMENT AND PLAN  Spencer Williams is a 20 y.o. male   Chronic migraine without aura  He does not want to consider preventive medication at this point  Imitrex, Phenergan as needed,  He has normal neurological examination, after discuss with patient, we decided to hold off imaging study   Marcial Pacas, M.D. Ph.D.  [facility] [address] [place], [place] [address] Ph: [phone] Fax: [phone]  CC: Spencer Marek, MD

## 2016-02-12 ENCOUNTER — Other Ambulatory Visit (HOSPITAL_COMMUNITY): Payer: Self-pay | Admitting: *Deleted

## 2016-02-13 ENCOUNTER — Ambulatory Visit (HOSPITAL_COMMUNITY)
Admission: RE | Admit: 2016-02-13 | Discharge: 2016-02-13 | Disposition: A | Discharge status: Home / Self Care | Payer: Medicaid Other | Source: Ambulatory Visit | Attending: Gastroenterology | Admitting: Gastroenterology

## 2016-02-13 DIAGNOSIS — K5190 Ulcerative colitis, unspecified, without complications: Secondary | ICD-10-CM | POA: Diagnosis present

## 2016-02-13 MED ORDER — SODIUM CHLORIDE 0.9 % IV SOLN
INTRAVENOUS | Status: DC
  Administered 2016-02-13: 09:00:00 via INTRAVENOUS

## 2016-02-13 MED ORDER — DIPHENHYDRAMINE HCL 25 MG PO CAPS: 25.0000 mg | ORAL_CAPSULE | Freq: Once | ORAL | Status: DC

## 2016-02-13 MED ORDER — ACETAMINOPHEN 325 MG PO TABS: 325.0000 mg | ORAL_TABLET | ORAL | Status: DC

## 2016-02-13 MED ORDER — SODIUM CHLORIDE 0.9 % IV SOLN
5.0000 mg/kg | Freq: Once | INTRAVENOUS | Status: AC
  Administered 2016-02-13: 400 mg via INTRAVENOUS
  Filled 2016-02-13: qty 40

## 2016-03-24 ENCOUNTER — Encounter: Payer: Self-pay | Admitting: Neurology

## 2016-03-24 ENCOUNTER — Ambulatory Visit (INDEPENDENT_AMBULATORY_CARE_PROVIDER_SITE_OTHER): Payer: Medicaid Other | Admitting: Neurology

## 2016-03-24 VITALS — BP 103/60 | HR 58 | Ht 67.0 in | Wt 162.5 lb

## 2016-03-24 DIAGNOSIS — G43709 Chronic migraine without aura, not intractable, without status migrainosus: Secondary | ICD-10-CM | POA: Diagnosis not present

## 2016-03-24 DIAGNOSIS — IMO0002 Reserved for concepts with insufficient information to code with codable children: Secondary | ICD-10-CM

## 2016-03-24 MED ORDER — RIZATRIPTAN BENZOATE 10 MG PO TBDP: 10.0000 mg | ORAL_TABLET | ORAL | Status: AC | PRN

## 2016-03-24 NOTE — Progress Notes (Signed)
Chief Complaint  Patient presents with  . Migraine    He is here with his mother today.  He estimates getting two migraines monthly.  Imitrex was not helpful and he has been using ibuprofen for pain management.  He is currently getting Remicade IV for his ulcerative colitis.  He has noticed chronic congestion since starting this treatment.      PATIENT: Estrellita Ludwig DOB: [birth year]  Chief Complaint  Patient presents with  . Migraine    He is here with his mother today.  He estimates getting two migraines monthly.  Imitrex was not helpful and he has been using ibuprofen for pain management.  He is currently getting Remicade IV for his ulcerative colitis.  He has noticed chronic congestion since starting this treatment.     HISTORICAL  Chinonso Echeverri 20 years old right-handed male, accompanied by his mother, seen in refer by his primary care physician Dr. Lorayne Marek for evaluation of migraine headaches in January 02 2016  He had a history of ulcerative colitis since 2016, presented with bloody diarrhea, fatigue, now is under good control with Remicade IV infusion every 2 months.  He began to experience frequent headaches since February 2017, he complains of bilateral holocranial headaches, throbbing, with associated light noise sensitivity, nauseous, lasting few hours to half day, she has tried over-the-counter Tylenol, did not help, he could not take NSAIDs and aspirin because of ulcerative colitis, sometimes he has to go to emergency room urgent care for Toradol shots, which has always been helpful.  He denies focal signs, no visual loss, no family history of migraine, over the past few weeks, he has been taking Claritin for his allergic symptoms, his headache overall has improved, he has average 1 severe headache each week.  UPDATE July 17th 2017: He still has bad headache, once every 1-2 weeks,He tried Imitrex 25 mg as needed does not help his symptoms, his ulcerative colitis is under  good control with current Remicade. He complains of stuffy nose,  REVIEW OF SYSTEMS: Full 14 system review of systems performed and notable only for Shortness of breath, flushing, allergy, runny nose, headaches  ALLERGIES: No Known Allergies  HOME MEDICATIONS: Current Outpatient Prescriptions  Medication Sig Dispense Refill  . acetaminophen (TYLENOL) 500 MG tablet Take 1,000 mg by mouth every 6 (six) hours as needed for mild pain, moderate pain or headache.    Marland Kitchen acidophilus (RISAQUAD) CAPS capsule Take 1 capsule by mouth daily. 30 capsule 0  . Biotin (BIOTIN MAXIMUM STRENGTH) 10 MG TABS Take 1 tablet by mouth daily.    . feeding supplement, RESOURCE BREEZE, (RESOURCE BREEZE) LIQD Take 1 Container by mouth 3 (three) times daily between meals. 237 mL 0  . ferrous sulfate (FERROUSUL) 325 (65 FE) MG tablet Take 1 tablet (325 mg total) by mouth 3 (three) times daily with meals. 90 tablet 3  . inFLIXimab (REMICADE) 100 MG injection Every 8 weeks.    . Multiple Vitamins-Minerals (HAIR/SKIN/NAILS PO) Take 1 tablet by mouth 2 (two) times daily.    . Omega-3 Fatty Acids (FISH OIL) 1000 MG CPDR Take 1,000 mg by mouth daily.    . Probiotic Product (PROBIOTIC DAILY PO) Take 1 tablet by mouth daily.    . promethazine (PHENERGAN) 25 MG tablet Take 1 tablet (25 mg total) by mouth every 6 (six) hours as needed for nausea or vomiting. 30 tablet 6  . Zinc 50 MG TABS Take 50 mg by mouth daily.     No current  facility-administered medications for this visit.    PAST MEDICAL HISTORY: Past Medical History  Diagnosis Date  . Ulcerative colitis ([place])   . Migraines     PAST SURGICAL HISTORY: Past Surgical History  Procedure Laterality Date  . Flexible sigmoidoscopy N/A 11/08/2014    Procedure: FLEXIBLE SIGMOIDOSCOPY;  Surgeon: Jeryl Columbia, MD;  Location: [facility];  Service: Endoscopy;  Laterality: N/A;    FAMILY HISTORY: Not significant  SOCIAL HISTORY:  Social History   Social History  .  Marital Status: Single    Spouse Name: N/A  . Number of Children: 0  . Years of Education: College   Occupational History  . Student    Social History Main Topics  . Smoking status: Never Smoker   . Smokeless tobacco: Never Used  . Alcohol Use: No  . Drug Use: No  . Sexual Activity: Not Currently   Other Topics Concern  . Not on file   Social History Narrative   Lives at home with mother and brother.   Right-handed.   No caffeine use.     PHYSICAL EXAM   Filed Vitals:   03/24/16 1622  BP: 103/60  Pulse: 58  Height: 5' 7"  (1.702 m)  Weight: 162 lb 8 oz (73.71 kg)    Not recorded      Body mass index is 25.45 kg/(m^2).  PHYSICAL EXAMNIATION:  Gen: NAD, conversant, well nourised, obese, well groomed                     Cardiovascular: Regular rate rhythm, no peripheral edema, warm, nontender. Eyes: Conjunctivae clear without exudates or hemorrhage Neck: Supple, no carotid bruise. Pulmonary: Clear to auscultation bilaterally   NEUROLOGICAL EXAM:  MENTAL STATUS: Speech:    Speech is normal; fluent and spontaneous with normal comprehension.  Cognition:     Orientation to time, place and person     Normal recent and remote memory     Normal Attention span and concentration     Normal Language, naming, repeating,spontaneous speech     Fund of knowledge   CRANIAL NERVES: CN II: Visual fields are full to confrontation. Fundoscopic exam is normal with sharp discs and no vascular changes. Pupils are round equal and briskly reactive to light. CN III, IV, VI: extraocular movement are normal. No ptosis. CN V: Facial sensation is intact to pinprick in all 3 divisions bilaterally. Corneal responses are intact.  CN VII: Face is symmetric with normal eye closure and smile. CN VIII: Hearing is normal to rubbing fingers CN IX, X: Palate elevates symmetrically. Phonation is normal. CN XI: Head turning and shoulder shrug are intact CN XII: Tongue is midline with normal  movements and no atrophy.  MOTOR: There is no pronator drift of out-stretched arms. Muscle bulk and tone are normal. Muscle strength is normal.  REFLEXES: Reflexes are 2+ and symmetric at the biceps, triceps, knees, and ankles. Plantar responses are flexor.  SENSORY: Intact to light touch, pinprick, positional sensation and vibratory sensation are intact in fingers and toes.  COORDINATION: Rapid alternating movements and fine finger movements are intact. There is no dysmetria on finger-to-nose and heel-knee-shin.    GAIT/STANCE: Posture is normal. Gait is steady with normal steps, base, arm swing, and turning. Heel and toe walking are normal. Tandem gait is normal.  Romberg is absent.   DIAGNOSTIC DATA (LABS, IMAGING, TESTING) - I reviewed patient records, labs, notes, testing and imaging myself where available.   ASSESSMENT AND PLAN  Chayanne Brisk is a 20 y.o. male   Chronic migraine without aura  He does not want to consider preventive medication at this point  Imitrex 25 mg as needed does not work  We will try Maxalt 10 mg and, Phenergan as needed,  He has normal neurological examination, after discuss with patient, we decided to hold off imaging study  Return to clinic in 3 months   Marcial Pacas, M.D. Ph.D.  [facility] [address] [place], [place] [address] Ph: [phone] Fax: [phone]  CC: Lorayne Marek, MD

## 2016-04-08 ENCOUNTER — Other Ambulatory Visit (HOSPITAL_COMMUNITY): Payer: Self-pay | Admitting: *Deleted

## 2016-04-09 ENCOUNTER — Encounter (HOSPITAL_COMMUNITY)
Admission: RE | Admit: 2016-04-09 | Discharge: 2016-04-09 | Disposition: A | Discharge status: Home / Self Care | Payer: Medicaid Other | Source: Ambulatory Visit | Attending: Gastroenterology | Admitting: Gastroenterology

## 2016-04-09 DIAGNOSIS — K5190 Ulcerative colitis, unspecified, without complications: Secondary | ICD-10-CM | POA: Diagnosis not present

## 2016-04-09 MED ORDER — ACETAMINOPHEN 325 MG PO TABS
ORAL_TABLET | ORAL | Status: AC
  Filled 2016-04-09: qty 1

## 2016-04-09 MED ORDER — DIPHENHYDRAMINE HCL 25 MG PO TABS
25.0000 mg | ORAL_TABLET | Freq: Once | ORAL | Status: AC
  Administered 2016-04-09: 25 mg via ORAL
  Filled 2016-04-09: qty 1

## 2016-04-09 MED ORDER — SODIUM CHLORIDE 0.9 % IV SOLN
INTRAVENOUS | Status: DC
  Administered 2016-04-09: 10:00:00 via INTRAVENOUS

## 2016-04-09 MED ORDER — DIPHENHYDRAMINE HCL 25 MG PO CAPS
ORAL_CAPSULE | ORAL | Status: AC
  Filled 2016-04-09: qty 1

## 2016-04-09 MED ORDER — ACETAMINOPHEN 325 MG PO TABS
325.0000 mg | ORAL_TABLET | ORAL | Status: DC
  Administered 2016-04-09: 325 mg via ORAL

## 2016-04-09 MED ORDER — INFLIXIMAB 100 MG IV SOLR
10.0000 mg/kg | INTRAVENOUS | Status: DC
  Administered 2016-04-09: 700 mg via INTRAVENOUS
  Filled 2016-04-09: qty 70

## 2016-06-03 ENCOUNTER — Other Ambulatory Visit (HOSPITAL_COMMUNITY): Payer: Self-pay | Admitting: *Deleted

## 2016-06-04 ENCOUNTER — Ambulatory Visit (HOSPITAL_COMMUNITY)
Admission: RE | Admit: 2016-06-04 | Discharge: 2016-06-04 | Disposition: A | Discharge status: Home / Self Care | Payer: Medicaid Other | Source: Ambulatory Visit | Attending: Gastroenterology | Admitting: Gastroenterology

## 2016-06-04 DIAGNOSIS — K5190 Ulcerative colitis, unspecified, without complications: Secondary | ICD-10-CM | POA: Insufficient documentation

## 2016-06-04 MED ORDER — ACETAMINOPHEN 325 MG PO TABS: 325.0000 mg | ORAL_TABLET | Freq: Once | ORAL | Status: DC

## 2016-06-04 MED ORDER — SODIUM CHLORIDE 0.9 % IV SOLN
10.0000 mg/kg | INTRAVENOUS | Status: AC
  Administered 2016-06-04: 700 mg via INTRAVENOUS
  Filled 2016-06-04: qty 10

## 2016-06-04 MED ORDER — SODIUM CHLORIDE 0.9 % IV SOLN
Freq: Once | INTRAVENOUS | Status: AC
  Administered 2016-06-04: 10:00:00 via INTRAVENOUS

## 2016-06-04 MED ORDER — DIPHENHYDRAMINE HCL 25 MG PO TABS
25.0000 mg | ORAL_TABLET | Freq: Once | ORAL | Status: DC
  Filled 2016-06-04: qty 1

## 2016-06-04 NOTE — Discharge Instructions (Signed)
° °  [facility] [address] [place], [place]  [address] Phone:  [phone]   June 04, 2016  Patient: Spencer Williams  Date of Birth: 1996/07/07  Date of Visit: 06/04/2016    To Whom It May Concern:  Demarkus Dietze was seen and treated  on 06/04/2016.  He  left at 1215.          If you have any questions or concerns, please don't hesitate to call.   Sincerely,   Anette Guarneri RN [phone]   Treatment Team:  Attending Provider: Arta Silence, MD

## 2016-06-23 ENCOUNTER — Telehealth: Payer: Self-pay | Admitting: *Deleted

## 2016-06-23 NOTE — Telephone Encounter (Signed)
I called and and I think I LM for pt to return call about appt tomorrow, needing to move up appt time.

## 2016-06-23 NOTE — Telephone Encounter (Signed)
LMVM for pt to return call (snd request).

## 2016-06-24 ENCOUNTER — Ambulatory Visit: Payer: Medicaid Other | Admitting: Nurse Practitioner

## 2016-07-30 ENCOUNTER — Ambulatory Visit (HOSPITAL_COMMUNITY)
Admission: RE | Admit: 2016-07-30 | Discharge: 2016-07-30 | Disposition: A | Discharge status: Home / Self Care | Payer: Medicaid Other | Source: Ambulatory Visit | Attending: Gastroenterology | Admitting: Gastroenterology

## 2016-07-30 DIAGNOSIS — K5190 Ulcerative colitis, unspecified, without complications: Secondary | ICD-10-CM | POA: Insufficient documentation

## 2016-07-30 MED ORDER — DIPHENHYDRAMINE HCL 25 MG PO CAPS
ORAL_CAPSULE | ORAL | Status: AC
  Filled 2016-07-30: qty 1

## 2016-07-30 MED ORDER — ACETAMINOPHEN 325 MG PO TABS
325.0000 mg | ORAL_TABLET | Freq: Once | ORAL | Status: AC
  Administered 2016-07-30: 325 mg via ORAL

## 2016-07-30 MED ORDER — ACETAMINOPHEN 325 MG PO TABS
ORAL_TABLET | ORAL | Status: AC
  Filled 2016-07-30: qty 1

## 2016-07-30 MED ORDER — DIPHENHYDRAMINE HCL 25 MG PO CAPS
25.0000 mg | ORAL_CAPSULE | Freq: Once | ORAL | Status: AC
  Administered 2016-07-30: 25 mg via ORAL

## 2016-07-30 MED ORDER — SODIUM CHLORIDE 0.9 % IV SOLN
10.0000 mg/kg | INTRAVENOUS | Status: DC
  Administered 2016-07-30: 700 mg via INTRAVENOUS
  Filled 2016-07-30: qty 70

## 2016-07-30 MED ORDER — SODIUM CHLORIDE 0.9 % IV SOLN
INTRAVENOUS | Status: DC
  Administered 2016-07-30: 09:00:00 via INTRAVENOUS

## 2016-09-23 ENCOUNTER — Other Ambulatory Visit (HOSPITAL_COMMUNITY): Payer: Self-pay | Admitting: *Deleted

## 2016-09-24 ENCOUNTER — Inpatient Hospital Stay (HOSPITAL_COMMUNITY): Admission: RE | Admit: 2016-09-24 | Payer: Medicaid Other | Source: Ambulatory Visit

## 2016-09-26 ENCOUNTER — Other Ambulatory Visit (HOSPITAL_COMMUNITY): Payer: Self-pay | Admitting: *Deleted

## 2016-09-29 ENCOUNTER — Encounter (HOSPITAL_COMMUNITY)
Admission: RE | Admit: 2016-09-29 | Discharge: 2016-09-29 | Disposition: A | Discharge status: Home / Self Care | Payer: Medicaid Other | Source: Ambulatory Visit | Attending: Gastroenterology | Admitting: Gastroenterology

## 2016-09-29 DIAGNOSIS — K5190 Ulcerative colitis, unspecified, without complications: Secondary | ICD-10-CM | POA: Diagnosis present

## 2016-09-29 MED ORDER — SODIUM CHLORIDE 0.9 % IV SOLN
INTRAVENOUS | Status: AC
  Administered 2016-09-29: 09:00:00 via INTRAVENOUS

## 2016-09-29 MED ORDER — ACETAMINOPHEN 325 MG PO TABS: 325.0000 mg | ORAL_TABLET | Freq: Once | ORAL | Status: DC

## 2016-09-29 MED ORDER — SODIUM CHLORIDE 0.9 % IV SOLN
10.0000 mg/kg | INTRAVENOUS | Status: AC
  Administered 2016-09-29: 09:00:00 700 mg via INTRAVENOUS
  Filled 2016-09-29: qty 70

## 2016-09-29 MED ORDER — DIPHENHYDRAMINE HCL 25 MG PO CAPS: 25.0000 mg | ORAL_CAPSULE | Freq: Once | ORAL | Status: DC

## 2016-09-29 NOTE — Discharge Instructions (Signed)
° °  [facility] [address] [place], [place]  [address] Phone:  [phone]   September 29, 2016  Patient: Spencer Williams  Date of Birth: Nov 22, 1995  Date of Visit: 09/29/2016    To Whom It May Concern:  Lorenzo Jaroszewski was seen and treated  on 09/29/2016.            If you have any questions or concerns, please don't hesitate to call.   Sincerely,  Anette Guarneri RN     Treatment Team:  Attending Provider: Arta Silence, MD

## 2016-11-18 ENCOUNTER — Inpatient Hospital Stay (HOSPITAL_COMMUNITY): Admission: RE | Admit: 2016-11-18 | Payer: Medicaid Other | Source: Ambulatory Visit

## 2016-11-24 ENCOUNTER — Other Ambulatory Visit (HOSPITAL_COMMUNITY): Payer: Self-pay | Admitting: *Deleted

## 2016-11-25 ENCOUNTER — Encounter (HOSPITAL_COMMUNITY)
Admission: RE | Admit: 2016-11-25 | Discharge: 2016-11-25 | Disposition: A | Discharge status: Home / Self Care | Payer: Medicaid Other | Source: Ambulatory Visit | Attending: Gastroenterology | Admitting: Gastroenterology

## 2016-11-25 DIAGNOSIS — K5190 Ulcerative colitis, unspecified, without complications: Secondary | ICD-10-CM | POA: Diagnosis present

## 2016-11-25 MED ORDER — SODIUM CHLORIDE 0.9 % IV SOLN
10.0000 mg/kg | INTRAVENOUS | Status: DC
  Administered 2016-11-25: 11:00:00 700 mg via INTRAVENOUS
  Filled 2016-11-25: qty 70

## 2016-11-25 MED ORDER — DIPHENHYDRAMINE HCL 25 MG PO CAPS: 25.0000 mg | ORAL_CAPSULE | ORAL | Status: DC

## 2016-11-25 MED ORDER — ACETAMINOPHEN 325 MG PO TABS: 325.0000 mg | ORAL_TABLET | ORAL | Status: DC

## 2016-11-25 MED ORDER — SODIUM CHLORIDE 0.9 % IV SOLN
INTRAVENOUS | Status: DC
  Administered 2016-11-25: 11:00:00 via INTRAVENOUS

## 2017-01-20 ENCOUNTER — Encounter (HOSPITAL_COMMUNITY)
Admission: RE | Admit: 2017-01-20 | Discharge: 2017-01-20 | Disposition: A | Discharge status: Home / Self Care | Payer: Medicaid Other | Source: Ambulatory Visit | Attending: Gastroenterology | Admitting: Gastroenterology

## 2017-01-20 DIAGNOSIS — K5190 Ulcerative colitis, unspecified, without complications: Secondary | ICD-10-CM | POA: Diagnosis present

## 2017-01-20 MED ORDER — DIPHENHYDRAMINE HCL 25 MG PO CAPS: 25.0000 mg | ORAL_CAPSULE | ORAL | Status: DC

## 2017-01-20 MED ORDER — ACETAMINOPHEN 325 MG PO TABS: 325.0000 mg | ORAL_TABLET | ORAL | Status: DC

## 2017-01-20 MED ORDER — SODIUM CHLORIDE 0.9 % IV SOLN
10.0000 mg/kg | INTRAVENOUS | Status: DC
  Administered 2017-01-20: 10:00:00 700 mg via INTRAVENOUS
  Filled 2017-01-20: qty 70

## 2017-01-20 MED ORDER — SODIUM CHLORIDE 0.9 % IV SOLN
INTRAVENOUS | Status: DC
  Administered 2017-01-20: 10:00:00 via INTRAVENOUS

## 2017-03-16 ENCOUNTER — Other Ambulatory Visit (HOSPITAL_COMMUNITY): Payer: Self-pay | Admitting: *Deleted

## 2017-03-17 ENCOUNTER — Encounter (HOSPITAL_COMMUNITY)
Admission: RE | Admit: 2017-03-17 | Discharge: 2017-03-17 | Disposition: A | Discharge status: Home / Self Care | Payer: Medicaid Other | Source: Ambulatory Visit | Attending: Gastroenterology | Admitting: Gastroenterology

## 2017-03-17 DIAGNOSIS — K5190 Ulcerative colitis, unspecified, without complications: Secondary | ICD-10-CM | POA: Insufficient documentation

## 2017-03-17 MED ORDER — SODIUM CHLORIDE 0.9 % IV SOLN
INTRAVENOUS | Status: DC
  Administered 2017-03-17: 12:00:00 via INTRAVENOUS

## 2017-03-17 MED ORDER — INFLIXIMAB 100 MG IV SOLR
10.0000 mg/kg | INTRAVENOUS | Status: DC
  Administered 2017-03-17: 12:00:00 700 mg via INTRAVENOUS
  Filled 2017-03-17: qty 70

## 2017-03-17 MED ORDER — ACETAMINOPHEN 325 MG PO TABS: 325.0000 mg | ORAL_TABLET | ORAL | Status: DC

## 2017-03-17 MED ORDER — DIPHENHYDRAMINE HCL 25 MG PO CAPS: 25.0000 mg | ORAL_CAPSULE | ORAL | Status: DC

## 2017-05-12 ENCOUNTER — Encounter (HOSPITAL_COMMUNITY)
Admission: RE | Admit: 2017-05-12 | Discharge: 2017-05-12 | Disposition: A | Discharge status: Home / Self Care | Payer: Self-pay | Source: Ambulatory Visit | Attending: Gastroenterology | Admitting: Gastroenterology

## 2017-05-12 DIAGNOSIS — K5190 Ulcerative colitis, unspecified, without complications: Secondary | ICD-10-CM | POA: Insufficient documentation

## 2017-05-12 MED ORDER — SODIUM CHLORIDE 0.9 % IV SOLN
INTRAVENOUS | Status: DC
  Administered 2017-05-12: 10:00:00 via INTRAVENOUS

## 2017-05-12 MED ORDER — DIPHENHYDRAMINE HCL 25 MG PO CAPS: 25.0000 mg | ORAL_CAPSULE | ORAL | Status: DC

## 2017-05-12 MED ORDER — SODIUM CHLORIDE 0.9 % IV SOLN
10.0000 mg/kg | INTRAVENOUS | Status: DC
  Administered 2017-05-12: 700 mg via INTRAVENOUS
  Filled 2017-05-12: qty 70

## 2017-05-12 MED ORDER — ACETAMINOPHEN 325 MG PO TABS: 325.0000 mg | ORAL_TABLET | ORAL | Status: DC

## 2017-07-06 ENCOUNTER — Encounter (HOSPITAL_COMMUNITY): Payer: Self-pay

## 2017-07-06 ENCOUNTER — Encounter (HOSPITAL_COMMUNITY)
Admission: RE | Admit: 2017-07-06 | Discharge: 2017-07-06 | Disposition: A | Discharge status: Home / Self Care | Payer: Self-pay | Source: Ambulatory Visit | Attending: Gastroenterology | Admitting: Gastroenterology

## 2017-07-06 DIAGNOSIS — K5190 Ulcerative colitis, unspecified, without complications: Secondary | ICD-10-CM | POA: Insufficient documentation

## 2017-07-06 MED ORDER — ACETAMINOPHEN 325 MG PO TABS: 325.0000 mg | ORAL_TABLET | ORAL | Status: DC

## 2017-07-06 MED ORDER — SODIUM CHLORIDE 0.9 % IV SOLN
10.0000 mg/kg | INTRAVENOUS | Status: AC
  Administered 2017-07-06: 700 mg via INTRAVENOUS
  Filled 2017-07-06: qty 70

## 2017-07-06 MED ORDER — SODIUM CHLORIDE 0.9 % IV SOLN
INTRAVENOUS | Status: AC
  Administered 2017-07-06: 11:00:00 via INTRAVENOUS

## 2017-07-06 MED ORDER — DIPHENHYDRAMINE HCL 25 MG PO CAPS: 25.0000 mg | ORAL_CAPSULE | ORAL | Status: DC

## 2017-07-06 MED FILL — Sodium Chloride IV Soln 0.9%: INTRAVENOUS | Qty: 250 | Status: AC

## 2017-07-06 MED FILL — Infliximab For IV Inj 100 MG: INTRAVENOUS | Qty: 70 | Status: AC

## 2017-08-31 ENCOUNTER — Encounter (HOSPITAL_COMMUNITY)
Admission: RE | Admit: 2017-08-31 | Discharge: 2017-08-31 | Disposition: A | Discharge status: Home / Self Care | Payer: Self-pay | Source: Ambulatory Visit | Attending: Gastroenterology | Admitting: Gastroenterology

## 2017-08-31 DIAGNOSIS — K5190 Ulcerative colitis, unspecified, without complications: Secondary | ICD-10-CM | POA: Insufficient documentation

## 2017-08-31 MED ORDER — DIPHENHYDRAMINE HCL 25 MG PO CAPS: 25.0000 mg | ORAL_CAPSULE | Freq: Once | ORAL | Status: DC

## 2017-08-31 MED ORDER — SODIUM CHLORIDE 0.9 % IV SOLN
INTRAVENOUS | Status: DC
  Administered 2017-08-31: 09:00:00 via INTRAVENOUS

## 2017-08-31 MED ORDER — SODIUM CHLORIDE 0.9 % IV SOLN
10.0000 mg/kg | INTRAVENOUS | Status: DC
  Administered 2017-08-31: 09:00:00 700 mg via INTRAVENOUS
  Filled 2017-08-31: qty 70

## 2017-08-31 MED ORDER — ACETAMINOPHEN 325 MG PO TABS: 325.0000 mg | ORAL_TABLET | Freq: Once | ORAL | Status: DC

## 2017-10-26 ENCOUNTER — Ambulatory Visit (HOSPITAL_COMMUNITY): Payer: No Typology Code available for payment source

## 2017-11-09 ENCOUNTER — Ambulatory Visit (HOSPITAL_COMMUNITY)
Admission: RE | Admit: 2017-11-09 | Discharge: 2017-11-09 | Disposition: A | Discharge status: Home / Self Care | Payer: 59 | Source: Ambulatory Visit | Attending: Gastroenterology | Admitting: Gastroenterology

## 2017-11-09 DIAGNOSIS — K5190 Ulcerative colitis, unspecified, without complications: Secondary | ICD-10-CM | POA: Diagnosis not present

## 2017-11-09 MED ORDER — ACETAMINOPHEN 325 MG PO TABS
ORAL_TABLET | ORAL | Status: AC
  Filled 2017-11-09: qty 1

## 2017-11-09 MED ORDER — SODIUM CHLORIDE 0.9 % IV SOLN
INTRAVENOUS | Status: AC
Start: 2017-11-09 — End: 2017-11-09
  Administered 2017-11-09: 09:00:00 via INTRAVENOUS

## 2017-11-09 MED ORDER — DIPHENHYDRAMINE HCL 25 MG PO CAPS
ORAL_CAPSULE | ORAL | Status: AC
  Filled 2017-11-09: qty 1

## 2017-11-09 MED ORDER — DIPHENHYDRAMINE HCL 25 MG PO CAPS
25.0000 mg | ORAL_CAPSULE | Freq: Once | ORAL | Status: AC
  Administered 2017-11-09: 08:00:00 25 mg via ORAL

## 2017-11-09 MED ORDER — ACETAMINOPHEN 325 MG PO TABS
325.0000 mg | ORAL_TABLET | Freq: Once | ORAL | Status: AC
  Administered 2017-11-09: 325 mg via ORAL

## 2017-11-09 MED ORDER — SODIUM CHLORIDE 0.9 % IV SOLN
10.0000 mg/kg | INTRAVENOUS | Status: AC
  Administered 2017-11-09: 09:00:00 700 mg via INTRAVENOUS
  Filled 2017-11-09: qty 70

## 2018-01-04 ENCOUNTER — Encounter (HOSPITAL_COMMUNITY)
Admission: RE | Admit: 2018-01-04 | Discharge: 2018-01-04 | Disposition: A | Discharge status: Home / Self Care | Payer: 59 | Source: Ambulatory Visit | Attending: Gastroenterology | Admitting: Gastroenterology

## 2018-01-04 DIAGNOSIS — K5190 Ulcerative colitis, unspecified, without complications: Secondary | ICD-10-CM | POA: Insufficient documentation

## 2018-01-04 MED ORDER — SODIUM CHLORIDE 0.9 % IV SOLN
10.0000 mg/kg | INTRAVENOUS | Status: DC
  Administered 2018-01-04: 09:00:00 700 mg via INTRAVENOUS
  Filled 2018-01-04: qty 70

## 2018-01-04 MED ORDER — SODIUM CHLORIDE 0.9 % IV SOLN
INTRAVENOUS | Status: DC
  Administered 2018-01-04: 09:00:00 via INTRAVENOUS

## 2018-01-04 MED ORDER — ACETAMINOPHEN 325 MG PO TABS
ORAL_TABLET | ORAL | Status: AC
  Filled 2018-01-04: qty 1

## 2018-01-04 MED ORDER — DIPHENHYDRAMINE HCL 25 MG PO CAPS
25.0000 mg | ORAL_CAPSULE | Freq: Four times a day (QID) | ORAL | Status: DC | PRN
  Administered 2018-01-04: 09:00:00 25 mg via ORAL

## 2018-01-04 MED ORDER — DIPHENHYDRAMINE HCL 25 MG PO CAPS
ORAL_CAPSULE | ORAL | Status: AC
Start: 2018-01-04 — End: 2018-01-04
  Filled 2018-01-04: qty 1

## 2018-01-04 MED ORDER — ACETAMINOPHEN 325 MG PO TABS
325.0000 mg | ORAL_TABLET | Freq: Four times a day (QID) | ORAL | Status: DC | PRN
  Administered 2018-01-04: 325 mg via ORAL

## 2018-02-26 ENCOUNTER — Encounter (HOSPITAL_COMMUNITY): Payer: 59

## 2018-02-26 ENCOUNTER — Ambulatory Visit (HOSPITAL_COMMUNITY)
Admission: RE | Admit: 2018-02-26 | Discharge: 2018-02-26 | Disposition: A | Discharge status: Home / Self Care | Payer: 59 | Source: Ambulatory Visit | Attending: Gastroenterology | Admitting: Gastroenterology

## 2018-02-26 DIAGNOSIS — K5190 Ulcerative colitis, unspecified, without complications: Secondary | ICD-10-CM | POA: Insufficient documentation

## 2018-02-26 MED ORDER — SODIUM CHLORIDE 0.9 % IV SOLN: 10.0000 mg/kg | INTRAVENOUS | Status: DC

## 2018-02-26 MED ORDER — SODIUM CHLORIDE 0.9 % IV SOLN
10.0000 mg/kg | INTRAVENOUS | Status: DC
  Administered 2018-02-26: 11:00:00 700 mg via INTRAVENOUS
  Filled 2018-02-26 (×2): qty 70

## 2018-02-26 MED ORDER — ACETAMINOPHEN 325 MG PO TABS: 325.0000 mg | ORAL_TABLET | Freq: Four times a day (QID) | ORAL | Status: DC | PRN

## 2018-02-26 MED ORDER — DIPHENHYDRAMINE HCL 25 MG PO CAPS: 25.0000 mg | ORAL_CAPSULE | Freq: Four times a day (QID) | ORAL | Status: DC | PRN

## 2018-03-01 ENCOUNTER — Encounter (HOSPITAL_COMMUNITY): Payer: 59

## 2018-04-23 ENCOUNTER — Encounter (HOSPITAL_COMMUNITY): Payer: 59

## 2018-04-29 ENCOUNTER — Ambulatory Visit (HOSPITAL_COMMUNITY)
Admission: RE | Admit: 2018-04-29 | Discharge: 2018-04-29 | Disposition: A | Discharge status: Home / Self Care | Payer: 59 | Source: Ambulatory Visit | Attending: Gastroenterology | Admitting: Gastroenterology

## 2018-04-29 DIAGNOSIS — K5190 Ulcerative colitis, unspecified, without complications: Secondary | ICD-10-CM | POA: Insufficient documentation

## 2018-04-29 MED ORDER — SODIUM CHLORIDE 0.9 % IV SOLN
INTRAVENOUS | Status: DC
  Administered 2018-04-29: 10:00:00 via INTRAVENOUS

## 2018-04-29 MED ORDER — INFLIXIMAB 100 MG IV SOLR
10.0000 mg/kg | INTRAVENOUS | Status: DC
  Administered 2018-04-29: 700 mg via INTRAVENOUS
  Filled 2018-04-29: qty 70

## 2018-04-29 MED ORDER — ONDANSETRON HCL 4 MG/2ML IJ SOLN: 4.0000 mg | Freq: Once | INTRAMUSCULAR | Status: DC

## 2018-04-29 MED ORDER — ACETAMINOPHEN 325 MG PO TABS: 325.0000 mg | ORAL_TABLET | Freq: Once | ORAL | Status: DC

## 2018-04-29 MED ORDER — DIPHENHYDRAMINE HCL 25 MG PO CAPS
25.0000 mg | ORAL_CAPSULE | Freq: Once | ORAL | Status: DC
Start: 2018-04-29 — End: 2018-04-30

## 2018-06-24 ENCOUNTER — Ambulatory Visit (HOSPITAL_COMMUNITY)
Admission: RE | Admit: 2018-06-24 | Discharge: 2018-06-24 | Disposition: A | Discharge status: Home / Self Care | Payer: 59 | Source: Ambulatory Visit | Attending: Gastroenterology | Admitting: Gastroenterology

## 2018-06-24 DIAGNOSIS — K5190 Ulcerative colitis, unspecified, without complications: Secondary | ICD-10-CM | POA: Insufficient documentation

## 2018-06-24 MED ORDER — SODIUM CHLORIDE 0.9 % IV SOLN
10.0000 mg/kg | INTRAVENOUS | Status: DC
  Administered 2018-06-24: 09:00:00 700 mg via INTRAVENOUS
  Filled 2018-06-24: qty 70

## 2018-06-24 MED ORDER — ACETAMINOPHEN 325 MG PO TABS: 325.0000 mg | ORAL_TABLET | Freq: Once | ORAL | Status: DC

## 2018-06-24 MED ORDER — DIPHENHYDRAMINE HCL 25 MG PO CAPS: 25.0000 mg | ORAL_CAPSULE | Freq: Once | ORAL | Status: DC

## 2018-06-24 MED ORDER — SODIUM CHLORIDE 0.9 % IV SOLN
INTRAVENOUS | Status: DC
  Administered 2018-06-24: 09:00:00 via INTRAVENOUS

## 2018-06-24 MED ORDER — ONDANSETRON HCL 4 MG/2ML IJ SOLN: 4.0000 mg | Freq: Once | INTRAMUSCULAR | Status: DC

## 2018-08-18 ENCOUNTER — Other Ambulatory Visit (HOSPITAL_COMMUNITY): Payer: Self-pay

## 2018-08-19 ENCOUNTER — Ambulatory Visit (HOSPITAL_COMMUNITY)
Admission: RE | Admit: 2018-08-19 | Discharge: 2018-08-19 | Disposition: A | Discharge status: Home / Self Care | Payer: 59 | Source: Ambulatory Visit | Attending: Gastroenterology | Admitting: Gastroenterology

## 2018-08-19 DIAGNOSIS — K5190 Ulcerative colitis, unspecified, without complications: Secondary | ICD-10-CM | POA: Insufficient documentation

## 2018-08-19 MED ORDER — ACETAMINOPHEN 325 MG PO TABS: 325.0000 mg | ORAL_TABLET | Freq: Once | ORAL | Status: DC

## 2018-08-19 MED ORDER — SODIUM CHLORIDE 0.9 % IV SOLN
INTRAVENOUS | Status: DC
  Administered 2018-08-19: 09:00:00 via INTRAVENOUS

## 2018-08-19 MED ORDER — SODIUM CHLORIDE 0.9 % IV SOLN
10.0000 mg/kg | INTRAVENOUS | Status: DC
  Administered 2018-08-19: 09:00:00 700 mg via INTRAVENOUS
  Filled 2018-08-19: qty 70

## 2018-08-19 MED ORDER — DIPHENHYDRAMINE HCL 25 MG PO CAPS: 25.0000 mg | ORAL_CAPSULE | Freq: Once | ORAL | Status: DC

## 2018-10-14 ENCOUNTER — Encounter (HOSPITAL_COMMUNITY)
Admission: RE | Admit: 2018-10-14 | Discharge: 2018-10-14 | Disposition: A | Discharge status: Home / Self Care | Payer: 59 | Source: Ambulatory Visit | Attending: Gastroenterology | Admitting: Gastroenterology

## 2018-10-14 DIAGNOSIS — K5190 Ulcerative colitis, unspecified, without complications: Secondary | ICD-10-CM | POA: Diagnosis present

## 2018-10-14 MED ORDER — ACETAMINOPHEN 325 MG PO TABS: 325.0000 mg | ORAL_TABLET | Freq: Once | ORAL | Status: DC

## 2018-10-14 MED ORDER — DIPHENHYDRAMINE HCL 25 MG PO CAPS: 25.0000 mg | ORAL_CAPSULE | Freq: Once | ORAL | Status: DC

## 2018-10-14 MED ORDER — SODIUM CHLORIDE 0.9 % IV SOLN
INTRAVENOUS | Status: DC
Start: 2018-10-14 — End: 2018-10-15
  Administered 2018-10-14: 09:00:00 via INTRAVENOUS

## 2018-10-14 MED ORDER — SODIUM CHLORIDE 0.9 % IV SOLN
10.0000 mg/kg | INTRAVENOUS | Status: DC
  Administered 2018-10-14: 700 mg via INTRAVENOUS
  Filled 2018-10-14 (×2): qty 70

## 2018-12-08 ENCOUNTER — Other Ambulatory Visit: Payer: Self-pay

## 2018-12-09 ENCOUNTER — Ambulatory Visit (HOSPITAL_COMMUNITY)
Admission: RE | Admit: 2018-12-09 | Discharge: 2018-12-09 | Disposition: A | Discharge status: Home / Self Care | Payer: 59 | Source: Ambulatory Visit | Attending: Gastroenterology | Admitting: Gastroenterology

## 2018-12-09 DIAGNOSIS — K5190 Ulcerative colitis, unspecified, without complications: Secondary | ICD-10-CM | POA: Diagnosis present

## 2018-12-09 MED ORDER — DIPHENHYDRAMINE HCL 25 MG PO CAPS: 25.0000 mg | ORAL_CAPSULE | Freq: Once | ORAL | Status: DC

## 2018-12-09 MED ORDER — SODIUM CHLORIDE 0.9 % IV SOLN
10.0000 mg/kg | INTRAVENOUS | Status: DC
  Administered 2018-12-09: 700 mg via INTRAVENOUS
  Filled 2018-12-09: qty 70

## 2018-12-09 MED ORDER — SODIUM CHLORIDE 0.9 % IV SOLN: INTRAVENOUS | Status: DC

## 2018-12-09 MED ORDER — ACETAMINOPHEN 325 MG PO TABS: 325.0000 mg | ORAL_TABLET | Freq: Once | ORAL | Status: DC

## 2019-02-03 ENCOUNTER — Inpatient Hospital Stay (HOSPITAL_COMMUNITY): Admission: RE | Admit: 2019-02-03 | Payer: 59 | Source: Ambulatory Visit

## 2019-03-31 ENCOUNTER — Encounter (HOSPITAL_COMMUNITY): Payer: 59

## 2022-08-08 DIAGNOSIS — Z419 Encounter for procedure for purposes other than remedying health state, unspecified: Secondary | ICD-10-CM | POA: Diagnosis not present

## 2022-09-08 DIAGNOSIS — Z419 Encounter for procedure for purposes other than remedying health state, unspecified: Secondary | ICD-10-CM | POA: Diagnosis not present

## 2022-10-09 DIAGNOSIS — Z419 Encounter for procedure for purposes other than remedying health state, unspecified: Secondary | ICD-10-CM | POA: Diagnosis not present

## 2022-10-30 ENCOUNTER — Telehealth: Payer: Self-pay

## 2022-10-30 NOTE — Telephone Encounter (Signed)
Mychart msg sent

## 2022-11-07 DIAGNOSIS — Z419 Encounter for procedure for purposes other than remedying health state, unspecified: Secondary | ICD-10-CM | POA: Diagnosis not present

## 2022-12-08 DIAGNOSIS — Z419 Encounter for procedure for purposes other than remedying health state, unspecified: Secondary | ICD-10-CM | POA: Diagnosis not present

## 2023-01-07 DIAGNOSIS — Z419 Encounter for procedure for purposes other than remedying health state, unspecified: Secondary | ICD-10-CM | POA: Diagnosis not present

## 2023-02-07 DIAGNOSIS — Z419 Encounter for procedure for purposes other than remedying health state, unspecified: Secondary | ICD-10-CM | POA: Diagnosis not present

## 2023-03-09 DIAGNOSIS — Z419 Encounter for procedure for purposes other than remedying health state, unspecified: Secondary | ICD-10-CM | POA: Diagnosis not present

## 2023-04-09 DIAGNOSIS — Z419 Encounter for procedure for purposes other than remedying health state, unspecified: Secondary | ICD-10-CM | POA: Diagnosis not present

## 2023-05-10 DIAGNOSIS — Z419 Encounter for procedure for purposes other than remedying health state, unspecified: Secondary | ICD-10-CM | POA: Diagnosis not present

## 2023-06-09 DIAGNOSIS — Z419 Encounter for procedure for purposes other than remedying health state, unspecified: Secondary | ICD-10-CM | POA: Diagnosis not present

## 2023-07-10 DIAGNOSIS — Z419 Encounter for procedure for purposes other than remedying health state, unspecified: Secondary | ICD-10-CM | POA: Diagnosis not present

## 2023-08-09 DIAGNOSIS — Z419 Encounter for procedure for purposes other than remedying health state, unspecified: Secondary | ICD-10-CM | POA: Diagnosis not present

## 2023-09-09 DIAGNOSIS — Z419 Encounter for procedure for purposes other than remedying health state, unspecified: Secondary | ICD-10-CM | POA: Diagnosis not present

## 2023-10-10 DIAGNOSIS — Z419 Encounter for procedure for purposes other than remedying health state, unspecified: Secondary | ICD-10-CM | POA: Diagnosis not present

## 2023-11-07 DIAGNOSIS — Z419 Encounter for procedure for purposes other than remedying health state, unspecified: Secondary | ICD-10-CM | POA: Diagnosis not present

## 2023-12-19 DIAGNOSIS — Z419 Encounter for procedure for purposes other than remedying health state, unspecified: Secondary | ICD-10-CM | POA: Diagnosis not present

## 2024-01-18 DIAGNOSIS — Z419 Encounter for procedure for purposes other than remedying health state, unspecified: Secondary | ICD-10-CM | POA: Diagnosis not present

## 2024-02-18 DIAGNOSIS — Z419 Encounter for procedure for purposes other than remedying health state, unspecified: Secondary | ICD-10-CM | POA: Diagnosis not present

## 2024-03-19 DIAGNOSIS — Z419 Encounter for procedure for purposes other than remedying health state, unspecified: Secondary | ICD-10-CM | POA: Diagnosis not present

## 2024-04-19 DIAGNOSIS — Z419 Encounter for procedure for purposes other than remedying health state, unspecified: Secondary | ICD-10-CM | POA: Diagnosis not present

## 2024-05-20 DIAGNOSIS — Z419 Encounter for procedure for purposes other than remedying health state, unspecified: Secondary | ICD-10-CM | POA: Diagnosis not present

## 2024-06-19 DIAGNOSIS — Z419 Encounter for procedure for purposes other than remedying health state, unspecified: Secondary | ICD-10-CM | POA: Diagnosis not present
